# Patient Record
Sex: Female | Born: 1955 | Race: Black or African American | Hispanic: No | Marital: Married | Smoking: Former smoker
Health system: Southern US, Community
[De-identification: ages and names within clinical notes are randomized; demographics above are authoritative.]

## PROBLEM LIST (undated history)

## (undated) DIAGNOSIS — F32A Depression, unspecified: Secondary | ICD-10-CM

## (undated) DIAGNOSIS — R0602 Shortness of breath: Secondary | ICD-10-CM

## (undated) DIAGNOSIS — K635 Polyp of colon: Secondary | ICD-10-CM

## (undated) DIAGNOSIS — R1310 Dysphagia, unspecified: Secondary | ICD-10-CM

## (undated) DIAGNOSIS — E739 Lactose intolerance, unspecified: Secondary | ICD-10-CM

## (undated) DIAGNOSIS — K5900 Constipation, unspecified: Secondary | ICD-10-CM

## (undated) DIAGNOSIS — J45909 Unspecified asthma, uncomplicated: Secondary | ICD-10-CM

## (undated) DIAGNOSIS — K219 Gastro-esophageal reflux disease without esophagitis: Secondary | ICD-10-CM

## (undated) DIAGNOSIS — M2550 Pain in unspecified joint: Secondary | ICD-10-CM

## (undated) DIAGNOSIS — M7989 Other specified soft tissue disorders: Secondary | ICD-10-CM

## (undated) DIAGNOSIS — K589 Irritable bowel syndrome without diarrhea: Secondary | ICD-10-CM

## (undated) DIAGNOSIS — F329 Major depressive disorder, single episode, unspecified: Secondary | ICD-10-CM

## (undated) DIAGNOSIS — N3010 Interstitial cystitis (chronic) without hematuria: Secondary | ICD-10-CM

## (undated) DIAGNOSIS — T783XXA Angioneurotic edema, initial encounter: Secondary | ICD-10-CM

## (undated) DIAGNOSIS — K760 Fatty (change of) liver, not elsewhere classified: Secondary | ICD-10-CM

## (undated) DIAGNOSIS — K829 Disease of gallbladder, unspecified: Secondary | ICD-10-CM

## (undated) DIAGNOSIS — L509 Urticaria, unspecified: Secondary | ICD-10-CM

## (undated) DIAGNOSIS — L309 Dermatitis, unspecified: Secondary | ICD-10-CM

## (undated) DIAGNOSIS — J309 Allergic rhinitis, unspecified: Secondary | ICD-10-CM

## (undated) DIAGNOSIS — E785 Hyperlipidemia, unspecified: Secondary | ICD-10-CM

## (undated) DIAGNOSIS — E559 Vitamin D deficiency, unspecified: Secondary | ICD-10-CM

## (undated) DIAGNOSIS — G4733 Obstructive sleep apnea (adult) (pediatric): Secondary | ICD-10-CM

## (undated) DIAGNOSIS — M797 Fibromyalgia: Secondary | ICD-10-CM

## (undated) DIAGNOSIS — M549 Dorsalgia, unspecified: Secondary | ICD-10-CM

## (undated) DIAGNOSIS — F419 Anxiety disorder, unspecified: Secondary | ICD-10-CM

## (undated) HISTORY — PX: ADENOIDECTOMY: SUR15

## (undated) HISTORY — DX: Interstitial cystitis (chronic) without hematuria: N30.10

## (undated) HISTORY — DX: Disease of gallbladder, unspecified: K82.9

## (undated) HISTORY — DX: Obstructive sleep apnea (adult) (pediatric): G47.33

## (undated) HISTORY — PX: TUBAL LIGATION: SHX77

## (undated) HISTORY — DX: Shortness of breath: R06.02

## (undated) HISTORY — DX: Unspecified asthma, uncomplicated: J45.909

## (undated) HISTORY — DX: Irritable bowel syndrome, unspecified: K58.9

## (undated) HISTORY — DX: Pain in unspecified joint: M25.50

## (undated) HISTORY — DX: Allergic rhinitis, unspecified: J30.9

## (undated) HISTORY — PX: APPENDECTOMY: SHX54

## (undated) HISTORY — DX: Gastro-esophageal reflux disease without esophagitis: K21.9

## (undated) HISTORY — DX: Fatty (change of) liver, not elsewhere classified: K76.0

## (undated) HISTORY — DX: Urticaria, unspecified: L50.9

## (undated) HISTORY — PX: VESICOVAGINAL FISTULA CLOSURE W/ TAH: SUR271

## (undated) HISTORY — DX: Lactose intolerance, unspecified: E73.9

## (undated) HISTORY — DX: Polyp of colon: K63.5

## (undated) HISTORY — DX: Dermatitis, unspecified: L30.9

## (undated) HISTORY — DX: Dorsalgia, unspecified: M54.9

## (undated) HISTORY — DX: Major depressive disorder, single episode, unspecified: F32.9

## (undated) HISTORY — DX: Vitamin D deficiency, unspecified: E55.9

## (undated) HISTORY — DX: Dysphagia, unspecified: R13.10

## (undated) HISTORY — DX: Other specified soft tissue disorders: M79.89

## (undated) HISTORY — DX: Constipation, unspecified: K59.00

## (undated) HISTORY — DX: Angioneurotic edema, initial encounter: T78.3XXA

## (undated) HISTORY — DX: Anxiety disorder, unspecified: F41.9

## (undated) HISTORY — DX: Hyperlipidemia, unspecified: E78.5

## (undated) HISTORY — DX: Fibromyalgia: M79.7

## (undated) HISTORY — DX: Depression, unspecified: F32.A

## (undated) HISTORY — PX: ABDOMINAL HYSTERECTOMY: SHX81

---

## 1999-08-04 ENCOUNTER — Encounter: Admission: RE | Admit: 1999-08-04 | Discharge: 1999-08-04 | Payer: Self-pay | Admitting: Family Medicine

## 1999-08-04 ENCOUNTER — Encounter: Payer: Self-pay | Admitting: Family Medicine

## 1999-09-25 ENCOUNTER — Emergency Department (HOSPITAL_COMMUNITY): Admission: EM | Admit: 1999-09-25 | Discharge: 1999-09-25 | Payer: Self-pay | Admitting: *Deleted

## 2000-01-06 ENCOUNTER — Encounter: Payer: Self-pay | Admitting: Maternal and Fetal Medicine

## 2000-01-06 ENCOUNTER — Ambulatory Visit (HOSPITAL_COMMUNITY): Admission: RE | Admit: 2000-01-06 | Discharge: 2000-01-06 | Payer: Self-pay | Admitting: Maternal and Fetal Medicine

## 2001-01-09 ENCOUNTER — Encounter: Payer: Self-pay | Admitting: Maternal and Fetal Medicine

## 2001-01-09 ENCOUNTER — Ambulatory Visit (HOSPITAL_COMMUNITY): Admission: RE | Admit: 2001-01-09 | Discharge: 2001-01-09 | Payer: Self-pay | Admitting: Obstetrics

## 2001-04-18 ENCOUNTER — Ambulatory Visit (HOSPITAL_COMMUNITY): Admission: RE | Admit: 2001-04-18 | Discharge: 2001-04-18 | Payer: Self-pay | Admitting: Family Medicine

## 2002-02-19 ENCOUNTER — Ambulatory Visit (HOSPITAL_COMMUNITY): Admission: RE | Admit: 2002-02-19 | Discharge: 2002-02-19 | Payer: Self-pay | Admitting: Family Medicine

## 2002-02-19 ENCOUNTER — Encounter: Payer: Self-pay | Admitting: Family Medicine

## 2002-10-28 ENCOUNTER — Ambulatory Visit (HOSPITAL_COMMUNITY): Admission: RE | Admit: 2002-10-28 | Discharge: 2002-10-28 | Payer: Self-pay | Admitting: Gastroenterology

## 2003-04-09 ENCOUNTER — Ambulatory Visit (HOSPITAL_COMMUNITY): Admission: RE | Admit: 2003-04-09 | Discharge: 2003-04-09 | Payer: Self-pay | Admitting: Obstetrics & Gynecology

## 2004-04-13 ENCOUNTER — Ambulatory Visit (HOSPITAL_COMMUNITY): Admission: RE | Admit: 2004-04-13 | Discharge: 2004-04-13 | Payer: Self-pay | Admitting: Family Medicine

## 2004-09-04 ENCOUNTER — Emergency Department (HOSPITAL_COMMUNITY): Admission: EM | Admit: 2004-09-04 | Discharge: 2004-09-04 | Payer: Self-pay | Admitting: Emergency Medicine

## 2005-09-21 ENCOUNTER — Ambulatory Visit: Payer: Self-pay | Admitting: Emergency Medicine

## 2005-10-17 ENCOUNTER — Ambulatory Visit: Payer: Self-pay | Admitting: Emergency Medicine

## 2006-01-24 ENCOUNTER — Ambulatory Visit (HOSPITAL_COMMUNITY): Admission: RE | Admit: 2006-01-24 | Discharge: 2006-01-24 | Payer: Self-pay | Admitting: Gastroenterology

## 2006-07-21 ENCOUNTER — Ambulatory Visit: Payer: Self-pay | Admitting: Emergency Medicine

## 2006-08-18 ENCOUNTER — Ambulatory Visit: Payer: Self-pay | Admitting: Internal Medicine

## 2006-10-18 ENCOUNTER — Ambulatory Visit: Payer: Self-pay | Admitting: Internal Medicine

## 2006-11-14 ENCOUNTER — Ambulatory Visit (HOSPITAL_COMMUNITY): Admission: RE | Admit: 2006-11-14 | Discharge: 2006-11-14 | Payer: Self-pay | Admitting: Obstetrics & Gynecology

## 2007-02-15 ENCOUNTER — Ambulatory Visit: Payer: Self-pay | Admitting: Internal Medicine

## 2007-02-15 DIAGNOSIS — J309 Allergic rhinitis, unspecified: Secondary | ICD-10-CM | POA: Insufficient documentation

## 2007-02-15 DIAGNOSIS — J45909 Unspecified asthma, uncomplicated: Secondary | ICD-10-CM | POA: Insufficient documentation

## 2007-02-15 DIAGNOSIS — L509 Urticaria, unspecified: Secondary | ICD-10-CM | POA: Insufficient documentation

## 2007-02-15 DIAGNOSIS — K219 Gastro-esophageal reflux disease without esophagitis: Secondary | ICD-10-CM | POA: Insufficient documentation

## 2007-07-27 ENCOUNTER — Emergency Department (HOSPITAL_COMMUNITY): Admission: EM | Admit: 2007-07-27 | Discharge: 2007-07-27 | Payer: Self-pay | Admitting: Emergency Medicine

## 2007-08-28 ENCOUNTER — Ambulatory Visit: Payer: Self-pay | Admitting: Internal Medicine

## 2007-11-15 ENCOUNTER — Ambulatory Visit (HOSPITAL_COMMUNITY): Admission: RE | Admit: 2007-11-15 | Discharge: 2007-11-15 | Payer: Self-pay | Admitting: Obstetrics & Gynecology

## 2008-01-18 ENCOUNTER — Telehealth (INDEPENDENT_AMBULATORY_CARE_PROVIDER_SITE_OTHER): Payer: Self-pay | Admitting: *Deleted

## 2008-06-09 ENCOUNTER — Ambulatory Visit: Payer: Self-pay | Admitting: Internal Medicine

## 2008-12-18 ENCOUNTER — Ambulatory Visit (HOSPITAL_COMMUNITY): Admission: RE | Admit: 2008-12-18 | Discharge: 2008-12-18 | Payer: Self-pay | Admitting: Family Medicine

## 2009-05-25 ENCOUNTER — Emergency Department (HOSPITAL_COMMUNITY): Admission: EM | Admit: 2009-05-25 | Discharge: 2009-05-25 | Payer: Self-pay | Admitting: Emergency Medicine

## 2010-02-10 ENCOUNTER — Encounter (INDEPENDENT_AMBULATORY_CARE_PROVIDER_SITE_OTHER): Payer: Self-pay | Admitting: *Deleted

## 2010-02-10 ENCOUNTER — Emergency Department (HOSPITAL_COMMUNITY): Admission: EM | Admit: 2010-02-10 | Discharge: 2010-02-10 | Payer: Self-pay | Admitting: Emergency Medicine

## 2010-06-13 HISTORY — PX: TONSILLECTOMY: SUR1361

## 2010-06-22 ENCOUNTER — Telehealth (INDEPENDENT_AMBULATORY_CARE_PROVIDER_SITE_OTHER): Payer: Self-pay | Admitting: *Deleted

## 2010-07-03 ENCOUNTER — Encounter: Payer: Self-pay | Admitting: Family Medicine

## 2010-07-04 ENCOUNTER — Encounter: Payer: Self-pay | Admitting: Obstetrics & Gynecology

## 2010-07-12 ENCOUNTER — Ambulatory Visit: Admit: 2010-07-12 | Payer: Self-pay | Admitting: Internal Medicine

## 2010-07-13 NOTE — Letter (Signed)
Summary: New Patient letter  [facility]  [address] [place], [place] [address]   Phone: [phone]  Fax: [phone]       02/10/2010 MRN: [id number]  [facility] [address] [place], [place]  [address]  Dear [name],  Welcome to the Gastroenterology Division at [facility].    You are scheduled to see Dr.  Arlyce Dice on 03-25-10 at 10:00a.m. on the 3rd floor at [facility], [address].  We ask that you try to arrive at our office 15 minutes prior to your appointment time to allow for check-in.  We would like you to complete the enclosed self-administered evaluation form prior to your visit and bring it with you on the day of your appointment.  We will review it with you.  Also, please bring a complete list of all your medications or, if you prefer, bring the medication bottles and we will list them.  Please bring your insurance card so that we may make a copy of it.  If your insurance requires a referral to see a specialist, please bring your referral form from your primary care physician.  Co-payments are due at the time of your visit and may be paid by cash, check or credit card.     Your office visit will consist of a consult with your physician (includes a physical exam), any laboratory testing he/she may order, scheduling of any necessary diagnostic testing (e.g. x-ray, ultrasound, CT-scan), and scheduling of a procedure (e.g. Endoscopy, Colonoscopy) if required.  Please allow enough time on your schedule to allow for any/all of these possibilities.    If you cannot keep your appointment, please call [phone] to cancel or reschedule prior to your appointment date.  This allows [place] the opportunity to schedule an appointment for another patient in need of care.  If you do not cancel or reschedule by 5 p.m. the business day prior to your appointment date, you will be charged a $50.00 late cancellation/no-show fee.    Thank you for  choosing [facility] for your medical needs.  We appreciate the opportunity to care for you.  Please visit [place] at our website  to learn more about our practice.                     Sincerely,                                                             The Gastroenterology Division

## 2010-07-15 NOTE — Progress Notes (Signed)
Summary: appointment-LMTCB x 1-pt returned call  Phone Note Call from Patient   Caller: Patient Call For: DR YOUNG Summary of Call: Patient came by and needs another inhailer preventil. She is waiting in the lobby. Initial call taken by: Vedia Coffer,  June 22, 2010 3:38 PM  Follow-up for Phone Call        patient stated that the nurse told her that she needed an appointment before she could get another inhailer. Dr. Sinclair Ship first available isnt until 2/9 and patient needs to be sooner than that. She can be reached [phone].Vedia Coffer  June 22, 2010 3:46 PM  Additional Follow-up for Phone Call Additional follow up Details #1::        CDY- is this okay to give her a sample of rescue inhaler if she schedules appt with you.  Last seen in 2009.  Pls advise thanks Vernie Murders  June 22, 2010 3:49 PM   Per CDY-yes this is okay to give as long as she knows to make and KEEP appt with CDY.Reynaldo Minium CMA  June 22, 2010 4:55 PM     Additional Follow-up for Phone Call Additional follow up Details #2::    LMOMTCB Vernie Murders  June 22, 2010 5:09 PM pT CALLED AND MADE AN APPT W/ DR YOUNG FOR THIS FRI 06/25/10 AT 9:45. NO CALL BACK NEEDED. Tivis Ringer, CNA  June 23, 2010 10:44 AM

## 2010-08-23 ENCOUNTER — Other Ambulatory Visit (HOSPITAL_COMMUNITY): Payer: Self-pay | Admitting: Obstetrics & Gynecology

## 2010-08-23 DIAGNOSIS — Z1231 Encounter for screening mammogram for malignant neoplasm of breast: Secondary | ICD-10-CM

## 2010-08-26 LAB — CBC
Hemoglobin: 13.4 g/dL (ref 12.0–15.0)
MCH: 30.6 pg (ref 26.0–34.0)
MCHC: 33.4 g/dL (ref 30.0–36.0)
MCV: 91.7 fL (ref 78.0–100.0)
RBC: 4.37 MIL/uL (ref 3.87–5.11)
WBC: 13 10*3/uL — ABNORMAL HIGH (ref 4.0–10.5)

## 2010-08-26 LAB — COMPREHENSIVE METABOLIC PANEL
Calcium: 10 mg/dL (ref 8.4–10.5)
Creatinine, Ser: 0.97 mg/dL (ref 0.4–1.2)
GFR calc non Af Amer: 60 mL/min — ABNORMAL LOW (ref >60)
Glucose, Bld: 138 mg/dL — ABNORMAL HIGH (ref 70–99)
Sodium: 140 mEq/L (ref 135–145)
Total Bilirubin: 1.3 mg/dL — ABNORMAL HIGH (ref 0.3–1.2)
Total Protein: 7.2 g/dL (ref 6.0–8.3)

## 2010-08-26 LAB — DIFFERENTIAL
Eosinophils Relative: 0 % (ref 0–5)
Lymphocytes Relative: 10 % — ABNORMAL LOW (ref 12–46)
Lymphs Abs: 1.3 10*3/uL (ref 0.7–4.0)
Monocytes Relative: 2 % — ABNORMAL LOW (ref 3–12)

## 2010-08-26 LAB — URINALYSIS, ROUTINE W REFLEX MICROSCOPIC
Glucose, UA: NEGATIVE mg/dL
Hgb urine dipstick: NEGATIVE
Ketones, ur: NEGATIVE mg/dL
Protein, ur: NEGATIVE mg/dL
pH: 6 (ref 5.0–8.0)

## 2010-08-26 LAB — POCT CARDIAC MARKERS
CKMB, poc: <1 ng/mL — ABNORMAL LOW (ref 1.0–8.0)
Myoglobin, poc: 49.8 ng/mL (ref 12–200)
Troponin i, poc: <0.05 ng/mL (ref 0.00–0.09)

## 2010-09-14 ENCOUNTER — Ambulatory Visit (HOSPITAL_COMMUNITY)
Admission: RE | Admit: 2010-09-14 | Discharge: 2010-09-14 | Disposition: A | Discharge status: Home / Self Care | Payer: 59 | Source: Ambulatory Visit | Attending: Obstetrics & Gynecology | Admitting: Obstetrics & Gynecology

## 2010-09-14 DIAGNOSIS — Z1231 Encounter for screening mammogram for malignant neoplasm of breast: Secondary | ICD-10-CM | POA: Insufficient documentation

## 2010-10-26 NOTE — Assessment & Plan Note (Signed)
[facility]                             PULMONARY OFFICE NOTE   NAME:Sharon Barry, Sharon Barry                     MRN:          [id number]  DATE:02/15/2007                            DOB:          1956-05-06    PROBLEM LIST:  1. Allergic rhinitis.  2. Mild atopic asthma.  3. Probable esophageal reflux.   HISTORY:  Eyes itching, some generalized itching, but no specific rash.  Benadryl at night at least helps her sleep.  She does not know if it  does anything else.  She complains of mucous in her throat and complains  of halitosis with postnasal drip, but does not seem able to bring out  much.  Having some hot flashes.  Skin testing in May had been positive  primarily for grass and weed pollens, house dust, and dust mite.  This  was reviewed with her.  She does not think current therapy has been  sufficient.   MEDICATIONS:  1. Ambien 10 mg q.h.s.  2. Asmanex 220 mg one puff at q.h.s.  3. Nasacort AQ.  4. Singulair 10 mg.  5. Premarin 0.3 mg.  6. Claritin D p.r.n.  7. Proventil HFA.  8. Home albuterol nebulizer.  9. Occasional Benadryl.   ALLERGIES:  SULFA, ADVAIR.   OBJECTIVE:  VITAL SIGNS:  Weight 148 pounds, blood pressure 114/78,  pulse 73, room air saturation 100%.  HEENT:  There are inclusion cysts on both tonsils without erythema or  exudate.  There is bilateral nasal turbinate edema giving speech a nasal  stuffy quality.  I do not see postnasal drainage.  She is not hoarse.  I  do not find adenopathy.  CHEST:  Clear.  HEART:  Sounds normal.   IMPRESSION:  1. Rhinitis with exacerbation.  2. Pruritus, nonspecific.   PLAN:  1. Sample Patanase one spray each nostril daily p.r.n.  2. Saline nasal lavage.  3. Mucinex.  4. Schedule return in two months follow-up.  Consider allergy testing      if needed.  Dust and mold avoidance was emphasized.     Clinton D. Maple Hudson, MD, Tonny Bollman, FACP  Electronically Signed    CDY/MedQ  DD: 02/18/2007   DT: 02/18/2007  Job #: [id number]   cc:   Duwayne Heck L. Mahaffey, M.D.

## 2010-10-29 NOTE — Op Note (Signed)
NAME:  Sharon Barry, Sharon Barry                        ACCOUNT NO.:  [id number]   MEDICAL RECORD NO.:  [id number]                   PATIENT TYPE:  AMB   LOCATION:  ENDO                                 FACILITY:  [facility]   PHYSICIAN:  Anselmo Rod, M.D.               DATE OF BIRTH:  04/23/1956   DATE OF PROCEDURE:  10/28/2002  DATE OF DISCHARGE:                                 OPERATIVE REPORT   PROCEDURE PERFORMED:  Screening colonoscopy.   ENDOSCOPIST:  Anselmo Rod, M.D.   INSTRUMENT USED:  Olympus videocolonoscope.   INDICATION FOR THE PROCEDURE:  A 55 year old African-American female with a  history of abdominal pain and rectal bleeding, rule out colonic polyps,  masses, etc.   PROCEDURE PREPARATION:  Informed consent was procured from the patient.  The  patient had fasted for eight hours prior to the procedure and prepped with a  bottle of magnesium citrate and a gallon of GoLYTELY the night prior to the  procedure.   PREPROCEDURE PHYSICAL:  VITAL SIGNS:  Stable.  NECK:  Supple.  CHEST:  Clear to auscultation.  S1 and S2 regular.  ABDOMEN:  Soft with normal bowel sounds.   DESCRIPTION OF THE PROCEDURE:  The patient was placed in the left lateral  decubitus position and sedated with 50 mg of Demerol and 5 mg of Versed  intravenously.  Once the patient was adequately sedated, and maintained on  low-flow oxygen and continuous cardiac monitoring, the Olympus  videocolonoscope was advanced from the rectum to the cecum without  difficulty.  There was some residual stool in the colon.  Multiple washings  were done.  Small lesions could have been missed.  The appendiceal orifice  and the ileocecal valve were clearly visualized and photographed.  No  masses, polyps, erosions, ulcerations, or diverticulosis was seen.  Small  internal hemorrhoids were seen on retroflexion in the rectum.  No other  abnormalities were identified.  The patient tolerated the procedure well  without  complications.   IMPRESSION:  1. Normal colonoscopy up to the cecum except for small, nonbleeding internal     hemorrhoids.  2. Some residual stool in the colon.  Very small lesions could have been     missed.    RECOMMENDATIONS:  1. A high fiber diet with liberal fluid intake has been advised.  2. Outpatient followup in the next two weeks or earlier if need be.                                               Anselmo Rod, M.D.    JNM/MEDQ  D:  10/28/2002  T:  10/28/2002  Job:  [id number]   cc:   Duwayne Heck L. Mahaffey, M.D.  [address]  [place]  [place] [address]  Fax: [phone]

## 2010-10-29 NOTE — Assessment & Plan Note (Signed)
[facility]                             PULMONARY OFFICE NOTE   NAME:Barry, Sharon KEENER                     MRN:          [id number]  DATE:10/18/2006                            DOB:          28-Nov-1955    PROBLEMS:  1. Allergic rhinitis.  2. Mild atopic asthma.  3. Probable esophageal reflux.   HISTORY:  She got tight in the chest initially when she went down to the  humidity in [place], but gradually got used to that. No recent obvious  reflux events. She comes today for allergy testing.   MEDICATIONS:  1. Ambien 10 mg.  2. Asmanex 1 puff nightly.  3. Nasacort AQ.  4. Singulair 10 mg.  5. AcipHex 20 mg.  6. Effexor 75 mg.  7. Claritin D.  8. Proventil HFN rescue inhaler.  9. Nebulizer with albuterol daily p.r.n.  10.Benadryl occasional p.r.n.  Drug intolerant of SULFA and ADVAIR.   OBJECTIVE:  Weight 159 pounds, blood pressure 114/76, pulse regular 88,  room air saturation 98%. Skin reveals diffuse ichthyosis. Conjunctivae  are clear. Nose is wet, but not obstructed. Lungs are clear. Heart  sounds are regular without murmur.   SKIN TEST:  Positive histamine, negative diluent controls. Positive  reactions particularly for house dust and dust mite, but also for a  mixture of grass, weed, and tree pollens.   IMPRESSION:  Allergic rhinitis, allergic asthma, a component of  esophageal reflux.   PLAN:  We discussed treatment options. Currently, she feels present  medicines are satisfactory. I have reviewed the possible addition of  allergy vaccine and discussed the risk and benefits with particular  interest if we find that she requires daily medication or is difficult  to control over long blocks of time. We are scheduling return in 4  months. She is going to pay  particular attention to how she feels through this spring and summer  using her existing medications. We are scheduling return in 4 months,  earlier p.r.n.     Clinton D.  Maple Hudson, MD, Tonny Bollman, FACP  Electronically Signed    CDY/MedQ  DD: 10/18/2006  DT: 10/19/2006  Job #: [id number]   cc:   Duwayne Heck L. Mahaffey, M.D.

## 2010-10-29 NOTE — Assessment & Plan Note (Signed)
[facility]                             PULMONARY OFFICE NOTE   NAME:Langenberg, JOSIANE LABINE                     MRN:          [id number]  DATE:07/21/2006                            DOB:          [birth year]    SUBJECTIVE:  Ms. Gariepy is a pleasant, 55 year old woman with a  history of allergic rhinitis, severe postnasal drip and associated upper  airway irritation and vocal cord dysfunction.  She also probably has  mild asthma that has been trigger-related in the past.  In particular,  she is sensitive to perfumes, car exhaust and smoke.  Since our last  visit, her breathing has been quite good and she is using Asmanex q.h.s.  for maintenance therapy.  She does not have any pulmonary limitations at  this time.  She is not using any supplemental albuterol.  She does  complain of clear postnasal drip that is increased since our last visit,  and which appears to be resulting in worsening cough.  She has also  noticed that she is doing more throat-clearing than at our prior visit.  She had been on scheduled Singulair, Claritin and Nasacort, but she has  only been using these medications on as-needed basis for several months.  She also was previously using scheduled nasal saline washes at least  once a day.  She believes that, when she was doing the nasal saline  washes, her postnasal drip was better than it is now.  She denies any  headache, sinus pressure or colored nasal drainage or epistaxis.   MEDICATIONS:  1. Ambien 10 mg q.h.s.  2. Asmanex 220 micrograms one inhalation q.h.s.  3. Nasacort AQ one spray to each nostril q.a.m. p.r.n.  4. Singulair 10 mg q.h.s. p.r.n.  5. AcipHex 20 mg daily.  6. Effexor 75 mg daily.  7. Claritin 10 mg daily p.r.n.  8. Proventil two puffs q. 4 hours p.r.n.  9. Benadryl p.r.n.   EXAM:  IN GENERAL:  This is a pleasant, well-appearing African-American  woman, who is in no distress on room air.  Weight is 154 pounds,  temperature 98.3, blood pressure 124/88, heart rate 73, spO2 99% on room  air.  HEENT EXAM:  The oropharynx is clear.  NECK:  Supple without any lymphadenopathy or stridor.  LUNGS:  Clear to auscultation bilaterally in the normal respiratory  cycle and also on a forced expiration.  HEART:  Has a regular rate and rhythm without murmur.  ABDOMEN:  Benign.  EXTREMITIES:  Have no cyanosis, clubbing or edema.  NEUROLOGICALLY:  She has a nonfocal exam.   IMPRESSION:  1. Probable mild asthma with well-defined triggers.  This appears to      be under good control at this time.  2. Allergic rhinitis with continued postnasal drip.  3. Upper airway irritation and vocal cord dysfunction.  4. GERD that continues to be well-controlled.  Her Protonix has been      changed to AcipHex and she is tolerating this change well.   PLANS:  1. I have asked her to restart her previous regimen for her postnasal  drip, which includes scheduled nasal saline washes, daily Singulair      and Claritin, as well as daily Nasacort AQ.  2. I will refer Ms. Vanatta to see Dr. Jetty Duhamel in our office      for allergy evaluation to see if there is anything else beyond the      regimen above that we can do to assist her with her postnasal drip.  3. She will continue her Asmanex q.h.s. as currently ordered.  4. She will continue her AcipHex for her GERD, as currently ordered.  5. Ms. Koval will follow with me on an as-needed basis.     Leslye Peer, MD  Electronically Signed    RSB/MedQ  DD: 07/21/2006  DT: 07/21/2006  Job #: [id number]   cc:   Duwayne Heck L. Mahaffey, M.D.  Clinton D. Maple Hudson, MD, FCCP, FACP

## 2010-10-29 NOTE — Assessment & Plan Note (Signed)
[facility]                             PULMONARY OFFICE NOTE   NAME:Sharon Barry, Sharon Barry                     MRN:          [id number]  DATE:08/18/2006                            DOB:          December 06, 1955    PROBLEM:  Allergy consultation at the kind request of Dr. Delton Coombes for this  55 year old woman with concerns of allergic rhinitis, asthma, and vocal  cord dysfunction.   HISTORY:  She was seen by Dr. Delton Coombes in February with a history of  allergic rhinitis, postnasal drip, vocal cord dysfunction, mild asthma  with allergic and irritant triggers, and esophageal reflux.  He felt her  asthma was well controlled at that point and continued her on inhaled  steroid as well as reflux therapy.   MEDICATION:  1. Ambien 10 mg.  2. Asmanex one puff at h.s.  3. Nasacort AQ.  4. Singulair 10 mg.  5. AcipHex 20 mg.  6. Effexor 75 mg.  7. P.r.n. use of Claritin D or Benadryl.  8. Albuterol rescue inhaler.   Drug intolerant of SULFA and of ADVAIR.   She had taken Claritin the day prior to this evaluation.  She describes  most frequent treatments as irritant odors, smokes and dust, proximity  to dogs.  She is worst in spring and summer seasons.  Nasacort AQ caused  a sore throat.   REVIEW OF SYSTEMS:  Productive cough; chest pains; acid indigestion with  anorexia; some difficulty swallowing and sore throat; headaches; itching  of eyes, nose and throat; anxiety and depression.  Sputum is sometimes  yellow.  Eyes and scalp itch at night.  Bothersome perennial postnasal  drainage.   PAST HISTORY:  1. Asthma.  2. Elevated cholesterol.  3. Allergic rhinitis.  4. Urticarial reaction to an insect sting, undefined.  5. [place] food said to cause rash and is avoided.   No problems with latex or contrast dye.  Aspirin causes GI upset.   SURGICAL HISTORY:  Hysterectomy and tubal ligation.   SOCIAL HISTORY:  Quit smoking in 1996.  She is married with two  children, works as a Child psychotherapist.   ENVIRONMENTAL:  They live in a house that is 56 years old.  Basement is  somewhat musty and she gets headaches sometimes if she is down there.  Gas heat, no pets.  Son smokes outdoors.  They have wall-to-wall carpet.  No encasings or air cleaners.  No feathers.   FAMILY HISTORY:  Mother had what sounds like a seasonal pattern of  angioedema and her brother would get swelling in the face if he mowed  the lawn.  Others with rheumatism and cancer, asthma.   OBJECTIVE:  VITAL SIGNS:  Weight 152 pounds, BP 122/90, pulse regular at  73, room air saturation 99%.  GENERAL:  She seems comfortable today with no evident rash or  adenopathy.  HEENT:  Nasal turbinates are distinctly pale and boggy with little  mucus, no visible polyps.  Her pharynx is clear with no erythema or  evidence of obvious drainage.  Conjunctivae are clear.  She can breathe  comfortably through  her nose with her mouth closed.  Speech quality is  normal.  There is no stridor.  There is a small left post cervical lymph  node which she says enlarges at times.  LUNG FIELDS:  Clear with unlabored breathing.  HEART SOUNDS:  Regular without murmur or gallop.  ABDOMEN:  Without hepatosplenomegaly.  EXTREMITIES:  Without cyanosis, clubbing or edema.   IMPRESSION:  1. Allergic rhinitis with irritant and allergic triggers suggested by      history.  2. Question allergic component to mild intermittent asthma.  3. Probable esophageal reflux.   PLAN:  We gave environmental information of emphasizing avoidance of  dust and irritant triggers.  Look harder at the basement where there is  suggestion that there may be must that is causing increased congestion  and headache.  She may need to run a dehumidifier.  Schedule return off  of antihistamines at least 3 days for skin testing.   I appreciate the chance to see her.     Clinton D. Maple Hudson, MD, Tonny Bollman, FACP  Electronically Signed    CDY/MedQ   DD: 08/19/2006  DT: 08/19/2006  Job #: [id number]   cc:   Duwayne Heck L. Mahaffey, M.D.

## 2011-03-04 LAB — CBC
Hemoglobin: 13.7
MCHC: 34
RBC: 4.47
RDW: 13.1

## 2011-03-04 LAB — COMPREHENSIVE METABOLIC PANEL
ALT: 22
AST: 22
Alkaline Phosphatase: 50
CO2: 26
Chloride: 105
GFR calc Af Amer: >60
GFR calc non Af Amer: >60
Glucose, Bld: 95
Potassium: 3.6
Sodium: 139

## 2011-03-04 LAB — URINALYSIS, ROUTINE W REFLEX MICROSCOPIC
Bilirubin Urine: NEGATIVE
Glucose, UA: NEGATIVE
Ketones, ur: NEGATIVE
Nitrite: NEGATIVE
pH: 7

## 2011-03-04 LAB — DIFFERENTIAL
Basophils Relative: 0
Eosinophils Absolute: 0
Eosinophils Relative: 1
Monocytes Absolute: 0.4
Monocytes Relative: 6

## 2011-03-04 LAB — WET PREP, GENITAL

## 2011-03-04 LAB — GC/CHLAMYDIA PROBE AMP, GENITAL: GC Probe Amp, Genital: NEGATIVE

## 2011-05-20 ENCOUNTER — Telehealth: Payer: Self-pay | Admitting: Internal Medicine

## 2011-05-20 NOTE — Telephone Encounter (Signed)
Sure - fine to switch

## 2011-05-20 NOTE — Telephone Encounter (Signed)
Dr. Sherene Sires are you okay with the switch. Please advise thanks

## 2011-05-20 NOTE — Telephone Encounter (Signed)
Dr. Maple Hudson per office protocol please advise if this change is okay with you. Thanks.

## 2011-05-21 NOTE — Telephone Encounter (Signed)
Ok with me needs to bring every active med, neb solution, inhaler with her to [facility]

## 2011-05-23 NOTE — Telephone Encounter (Signed)
Pt already scheduled to see MW on 06-16-11- I just want to inform her needs to bring all meds in hand including OTC meds and inhalers/neb solutions to appt. ATC her and NA, mailbox full so no option to leave a msg.

## 2011-05-24 NOTE — Telephone Encounter (Signed)
ATC x 1. Mailbox is full. WCB later.

## 2011-05-25 NOTE — Telephone Encounter (Signed)
ATC NA and no option to leave a msg 

## 2011-05-26 NOTE — Telephone Encounter (Signed)
LMTCB x 1 at cell # for pt. 

## 2011-05-27 NOTE — Telephone Encounter (Signed)
Called spoke with patient on mobile number.  Informed her to bring all her medication bottles with her to ov: prescriptions, OTC, nebs and inhalers.  Pt verbalized her understanding.  Will sign off.

## 2011-06-15 ENCOUNTER — Encounter: Payer: Self-pay | Admitting: Pulmonary Disease

## 2011-06-16 ENCOUNTER — Encounter: Payer: Self-pay | Admitting: Internal Medicine

## 2011-06-16 ENCOUNTER — Ambulatory Visit (INDEPENDENT_AMBULATORY_CARE_PROVIDER_SITE_OTHER): Payer: 59 | Admitting: Internal Medicine

## 2011-06-16 ENCOUNTER — Ambulatory Visit (INDEPENDENT_AMBULATORY_CARE_PROVIDER_SITE_OTHER)
Admission: RE | Admit: 2011-06-16 | Discharge: 2011-06-16 | Disposition: A | Discharge status: Home / Self Care | Payer: 59 | Source: Ambulatory Visit | Attending: Internal Medicine | Admitting: Internal Medicine

## 2011-06-16 VITALS — BP 122/90 | HR 67 | Temp 98.0°F | Ht 65.0 in | Wt 184.6 lb

## 2011-06-16 DIAGNOSIS — J45909 Unspecified asthma, uncomplicated: Secondary | ICD-10-CM

## 2011-06-16 MED ORDER — MONTELUKAST SODIUM 10 MG PO TABS: 10.0000 mg | ORAL_TABLET | Freq: Every day | ORAL | Status: DC

## 2011-06-16 NOTE — Progress Notes (Signed)
  Subjective:    Patient ID: Sharon Barry, female    DOB: Nov 15, 1955, 56 y.o.   MRN: [id number]  HPI  29 yobf  Quit smoking 1995 no breathing problems then 2000 began trouble breathing intermittently worse in extreme hot or cold but present daily referred 06/16/2011 to pulmonary clinic by Dr Orson Aloe   06/16/2011 1st pulmonary eval cc constant urge to clear throat x 10 plus years worse with exposure to exhaust or perfume or cig smoking already found allergic to dust and mold tends to cough about a  tsp each am clear mucus,   does not disturb sleep,  much  better on inhalers previously (not sure which one helped but on multiple types per records review).   Worse than usual x one month assoc with mild sob mostly when coughing.  No overt sinus or hb complaints.  Sleeping ok without nocturnal  or early am exacerbation  of respiratory  c/o's or need for noct saba. Also denies any obvious fluctuation of symptoms with weather or environmental changes or other aggravating or alleviating factors except as outlined above       Review of Systems  Constitutional: Negative for fever, chills and unexpected weight change.  HENT: Positive for sneezing. Negative for ear pain, nosebleeds, congestion, sore throat, rhinorrhea, trouble swallowing, dental problem, voice change, postnasal drip and sinus pressure.   Eyes: Negative for visual disturbance.  Respiratory: Positive for shortness of breath. Negative for choking.   Cardiovascular: Positive for leg swelling. Negative for chest pain.  Gastrointestinal: Negative for vomiting, abdominal pain and diarrhea.  Genitourinary: Negative for difficulty urinating.  Musculoskeletal: Positive for arthralgias.  Skin: Negative for rash.  Neurological: Negative for tremors, syncope and headaches.  Hematological: Does not bruise/bleed easily.       Objective:   Physical Exam amb bf nad, unusual affect, freq throat clearing  06/16/2011  Wt 184  HEENT: nl  dentition, turbinates, and orophanx. Nl external ear canals without cough reflex   NECK :  without JVD/Nodes/TM/ nl carotid upstrokes bilaterally   LUNGS: no acc muscle use, clear to A and P bilaterally without cough on insp or exp maneuvers   CV:  RRR  no s3 or murmur or increase in P2, no edema   ABD:  soft and nontender with nl excursion in the supine position. No bruits or organomegaly, bowel sounds nl  MS:  warm without deformities, calf tenderness, cyanosis or clubbing  SKIN: warm and dry without lesions    NEURO:  alert, approp, no deficits   CXR  06/16/2011 :  No acute disease.         Assessment & Plan:

## 2011-06-16 NOTE — Assessment & Plan Note (Addendum)
Symptoms are markedly disproportionate to objective findings and not clear this is a lung problem but pt does appear to have difficult airway management issues. DDX of  difficult airways managment all start with A and  include Adherence, Ace Inhibitors, Acid Reflux, Active Sinus Disease, Alpha 1 Antitripsin deficiency, Anxiety masquerading as Airways dz,  ABPA,  allergy(esp in young), Aspiration (esp in elderly), Adverse effects of DPI,  Active smokers, plus two Bs  = Bronchiectasis and Beta blocker use..and one C= CHF   Adherence is always the initial "prime suspect" and is a multilayered concern that requires a "trust but verify" approach in every patient - starting with knowing how to use medications, especially inhalers, correctly, keeping up with refills and understanding the fundamental difference between maintenance and prns vs those medications only taken for a very short course and then stopped and not refilled.   For now makes the most sense to start one agent at a time and critically evaluated each one before adding another making sure in the end that " the punishment fits the crime" esp since so many of her symptoms seem so mild

## 2011-06-16 NOTE — Patient Instructions (Addendum)
GERD (REFLUX)  is an extremely common cause of respiratory symptoms, many times with no significant heartburn at all.    It can be treated with medication, but also with lifestyle changes including avoidance of late meals, excessive alcohol, smoking cessation, and avoid fatty foods, chocolate, peppermint, colas, red wine, and acidic juices such as orange juice.  NO MINT OR MENTHOL PRODUCTS SO NO COUGH DROPS  USE SUGARLESS CANDY INSTEAD (jolley ranchers or Stover's)  NO OIL BASED VITAMINS - use powdered substitutes. STOP FISH OIL  Singulair 10 mg every evening and if not better after a couple of weeks stop it and start Zegerd x2 at night until you return  Please remember to go to the  x-ray department downstairs for your tests - we will call you with the results when they are available.     Please schedule a follow up office visit in 4 weeks, sooner if needed with pft's

## 2011-06-29 NOTE — Progress Notes (Signed)
Quick Note:  lmtcb ______ 

## 2011-07-04 ENCOUNTER — Encounter: Payer: Self-pay | Admitting: *Deleted

## 2011-07-04 NOTE — Progress Notes (Signed)
Quick Note:  ATC and NA and no option to leave msg, Will send letter ______

## 2011-07-06 ENCOUNTER — Telehealth: Payer: Self-pay | Admitting: Internal Medicine

## 2011-07-06 NOTE — Telephone Encounter (Signed)
Notes Recorded by Sandrea Hughs, MD on 06/16/2011 at 9:18 PM Call pt: Reviewed cxr and no acute change so no change in recommendations made at ov  I spoke with patient about results and she verbalized understanding and had no questions

## 2011-07-20 ENCOUNTER — Ambulatory Visit (INDEPENDENT_AMBULATORY_CARE_PROVIDER_SITE_OTHER): Payer: 59 | Admitting: Internal Medicine

## 2011-07-20 ENCOUNTER — Encounter: Payer: Self-pay | Admitting: Internal Medicine

## 2011-07-20 ENCOUNTER — Other Ambulatory Visit (INDEPENDENT_AMBULATORY_CARE_PROVIDER_SITE_OTHER): Payer: 59

## 2011-07-20 VITALS — BP 116/74 | HR 78 | Temp 98.3°F | Ht 64.0 in | Wt 183.0 lb

## 2011-07-20 DIAGNOSIS — R059 Cough, unspecified: Secondary | ICD-10-CM

## 2011-07-20 DIAGNOSIS — J45909 Unspecified asthma, uncomplicated: Secondary | ICD-10-CM

## 2011-07-20 DIAGNOSIS — R05 Cough: Secondary | ICD-10-CM | POA: Insufficient documentation

## 2011-07-20 LAB — CBC WITH DIFFERENTIAL/PLATELET
Basophils Relative: 0.3 % (ref 0.0–3.0)
Eosinophils Relative: 0.8 % (ref 0.0–5.0)
HCT: 40.9 % (ref 36.0–46.0)
Hemoglobin: 13.6 g/dL (ref 12.0–15.0)
Lymphs Abs: 2.1 10*3/uL (ref 0.7–4.0)
MCV: 92.3 fl (ref 78.0–100.0)
Monocytes Absolute: 0.5 10*3/uL (ref 0.1–1.0)
Monocytes Relative: 5.9 % (ref 3.0–12.0)
Neutro Abs: 5.7 10*3/uL (ref 1.4–7.7)
RBC: 4.44 Mil/uL (ref 3.87–5.11)
WBC: 8.4 10*3/uL (ref 4.5–10.5)

## 2011-07-20 LAB — PULMONARY FUNCTION TEST

## 2011-07-20 NOTE — Patient Instructions (Addendum)
Zegrid take 2 at bedtime and stop the singulair  GERD (REFLUX)  is an extremely common cause of respiratory symptoms, many times with no significant heartburn at all.    It can be treated with medication, but also with lifestyle changes including avoidance of late meals, excessive alcohol, smoking cessation, and avoid fatty foods, chocolate, peppermint, colas, red wine, and acidic juices such as orange juice.  NO MINT OR MENTHOL PRODUCTS SO NO COUGH DROPS  USE SUGARLESS CANDY INSTEAD (jolley ranchers or Stover's)  NO OIL BASED VITAMINS - use powdered substitutes. (no fish oil)    Please see patient coordinator before you leave today  to schedule sinus CT  Please remember to go to the lab department downstairs for your tests - we will call you with the results when they are available.  Please schedule a follow up office visit in 4 weeks, sooner if needed

## 2011-07-20 NOTE — Progress Notes (Signed)
PFT done today. 

## 2011-07-20 NOTE — Assessment & Plan Note (Signed)
No evidence of asthma, try off singulair

## 2011-07-20 NOTE — Assessment & Plan Note (Signed)
The most common causes of chronic cough in immunocompetent adults include the following: upper airway cough syndrome (UACS), previously referred to as postnasal drip syndrome (PNDS), which is caused by variety of rhinosinus conditions; (2) asthma; (3) GERD; (4) chronic bronchitis from cigarette smoking or other inhaled environmental irritants; (5) nonasthmatic eosinophilic bronchitis; and (6) bronchiectasis.   These conditions, singly or in combination, have accounted for up to 94% of the causes of chronic cough in prospective studies.   Other conditions have constituted no >6% of the causes in prospective studies These have included bronchogenic carcinoma, chronic interstitial pneumonia, sarcoidosis, left ventricular failure, ACEI-induced cough, and aspiration from a condition associated with pharyngeal dysfunction.   This is most c/w  Classic Upper airway cough syndrome, so named because it's frequently impossible to sort out how much is  CR/sinusitis with freq throat clearing (which can be related to primary GERD)   vs  causing  secondary (" extra esophageal")  GERD from wide swings in gastric pressure that occur with throat clearing, often  promoting self use of mint and menthol lozenges that reduce the lower esophageal sphincter tone and exacerbate the problem further in a cyclical fashion.   These are the same pts who not infrequently have failed to tolerate ace inhibitors,  dry powder inhalers or biphosphonates or report having reflux symptoms that don't respond to standard doses of PPI , and are easily confused as having aecopd or asthma flares, which she does not appear to have  Therefor proceed with w/u including sinus ct, allergy eval and off singulair

## 2011-07-20 NOTE — Progress Notes (Signed)
  Subjective:    Patient ID: Sharon Barry, female    DOB: 1956-04-10     MRN: [id number]  HPI  26 yobf  Quit smoking 1995 no breathing problems then 2000 began trouble breathing intermittently worse in extreme hot or cold but present daily referred 06/16/2011 to pulmonary clinic by Dr Orson Aloe   06/16/2011 1st pulmonary eval cc constant urge to clear throat x 10 plus years worse with exposure to exhaust or perfume or cig smoking already found allergic to dust and mold tends to cough about a  tsp each am clear mucus,   does not disturb sleep,  much  better on inhalers previously (not sure which one helped but on multiple types per records review).   Worse than usual x one month assoc with mild sob mostly when coughing.  No overt sinus or hb complaints. rec Singulair 10 mg every evening and if not better after a couple of weeks stop it and start Zegerd x2 at night until you return      07/20/2011 f/u ov/Sharon Barry PFT done today.Marland KitchenMarland KitchenStill has prod cough with yellow mucus...worse in the mornings. Reports two siblings similar cough.  No overt hb symptoms, no better on singular     Sleeping ok without nocturnal  or early am exacerbation  of respiratory  c/o's or need for noct saba. Also denies any obvious fluctuation of symptoms with weather or environmental changes or other aggravating or alleviating factors except as outlined above.  ROS  At present neg for  any significant sore throat, dysphagia, itching, sneezing,  nasal congestion or excess/ purulent secretions,  fever, chills, sweats, unintended wt loss, pleuritic or exertional cp, hempoptysis, orthopnea pnd or leg swelling.  Also denies presyncope, palpitations, heartburn, abdominal pain, nausea, vomiting, diarrhea  or change in bowel or urinary habits, dysuria,hematuria,  rash, arthralgias, visual complaints, headache, numbness weakness or ataxia.               Objective:   Physical Exam amb bf nad, unusual affect, freq throat clearing  has resolved  06/16/2011  Wt 184 > 07/20/2011  183  HEENT: nl dentition, turbinates, and orophanx. Nl external ear canals without cough reflex   NECK :  without JVD/Nodes/TM/ nl carotid upstrokes bilaterally   LUNGS: no acc muscle use, clear to A and P bilaterally without cough on insp or exp maneuvers   CV:  RRR  no s3 or murmur or increase in P2, no edema   ABD:  soft and nontender with nl excursion in the supine position. No bruits or organomegaly, bowel sounds nl  MS:  warm without deformities, calf tenderness, cyanosis or clubbing     CXR  06/16/2011 :  No acute disease.         Assessment & Plan:

## 2011-07-21 ENCOUNTER — Encounter: Payer: Self-pay | Admitting: Internal Medicine

## 2011-07-22 ENCOUNTER — Encounter: Payer: Self-pay | Admitting: Internal Medicine

## 2011-07-22 LAB — ALLERGY PROFILE REGION II-DC, DE, MD, ~~LOC~~, VA
Allergen, D pternoyssinus,d7: <0.1 kU/L (ref <0.35)
Alternaria Alternata: <0.1 kU/L (ref <0.35)
Aspergillus fumigatus, IgG: <0.1 kU/L (ref <0.35)
Box Elder IgE: <0.1 kU/L (ref <0.35)
Cladosporium Herbarum: <0.1 kU/L (ref <0.35)
Cockroach: <0.1 kU/L (ref <0.35)
Dog Dander: <0.1 kU/L (ref <0.35)
Pecan/Hickory Tree IgE: <0.1 kU/L (ref <0.35)

## 2011-07-25 ENCOUNTER — Ambulatory Visit (INDEPENDENT_AMBULATORY_CARE_PROVIDER_SITE_OTHER)
Admission: RE | Admit: 2011-07-25 | Discharge: 2011-07-25 | Disposition: A | Discharge status: Home / Self Care | Payer: 59 | Source: Ambulatory Visit | Attending: Internal Medicine | Admitting: Internal Medicine

## 2011-07-25 DIAGNOSIS — J45909 Unspecified asthma, uncomplicated: Secondary | ICD-10-CM

## 2011-07-25 NOTE — Progress Notes (Signed)
Quick Note:  Spoke with pt and notified of results per Dr. Wert. Pt verbalized understanding and denied any questions.  ______ 

## 2011-07-26 ENCOUNTER — Encounter: Payer: Self-pay | Admitting: Internal Medicine

## 2012-03-16 ENCOUNTER — Other Ambulatory Visit (HOSPITAL_COMMUNITY): Payer: Self-pay | Admitting: Obstetrics & Gynecology

## 2012-03-16 DIAGNOSIS — Z1231 Encounter for screening mammogram for malignant neoplasm of breast: Secondary | ICD-10-CM

## 2012-04-23 ENCOUNTER — Ambulatory Visit (HOSPITAL_COMMUNITY)
Admission: RE | Admit: 2012-04-23 | Discharge: 2012-04-23 | Disposition: A | Discharge status: Home / Self Care | Payer: 59 | Source: Ambulatory Visit | Attending: Obstetrics & Gynecology | Admitting: Obstetrics & Gynecology

## 2012-04-23 DIAGNOSIS — Z1231 Encounter for screening mammogram for malignant neoplasm of breast: Secondary | ICD-10-CM | POA: Insufficient documentation

## 2012-05-06 IMAGING — CT CT PARANASAL SINUSES LIMITED
1 of 2 series · 16 of 19 positions shown, 20 images · non-contrast
Comparison: None.

CLINICAL DATA: 55-year-old female with sinus congestion, drainage,
cough.

CT LIMITED SINUSES WITHOUT CONTRAST
TECHNIQUE: Multidetector CT images of the paranasal sinuses were
obtained in a single plane without contrast.

[Series 4: ltd sinus 3.0 h30s · axial · 0.34mm/px · z∈[-114,-16]mm · 16 of 18 slices shown, 20 images]
[im 2/18  brain]
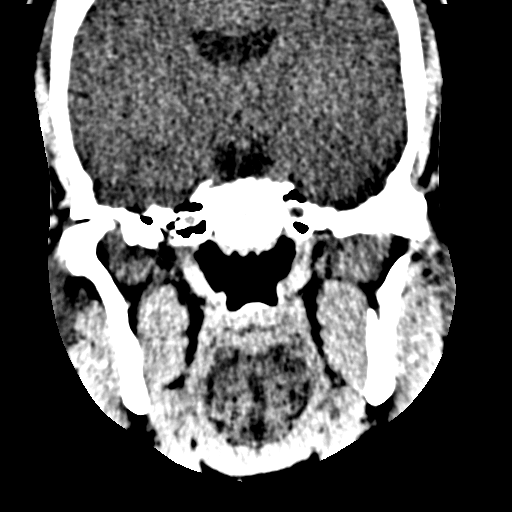
[im 2/18  bone]
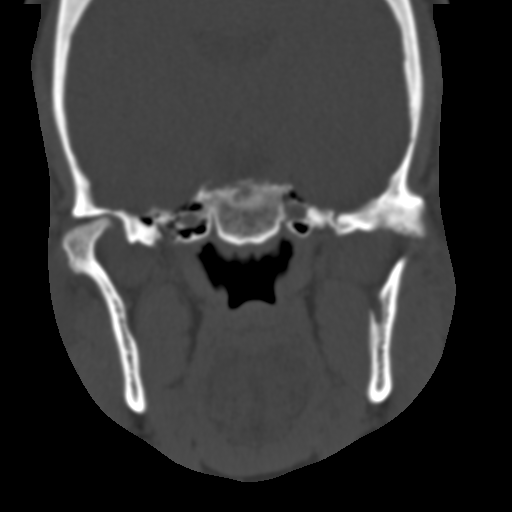
[im 3/18  bone]
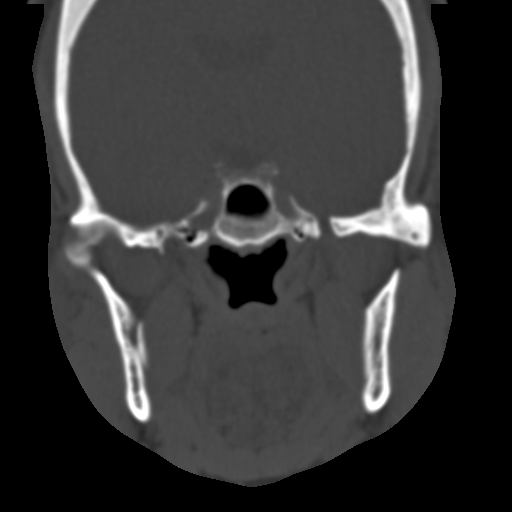
[im 4/18  bone]
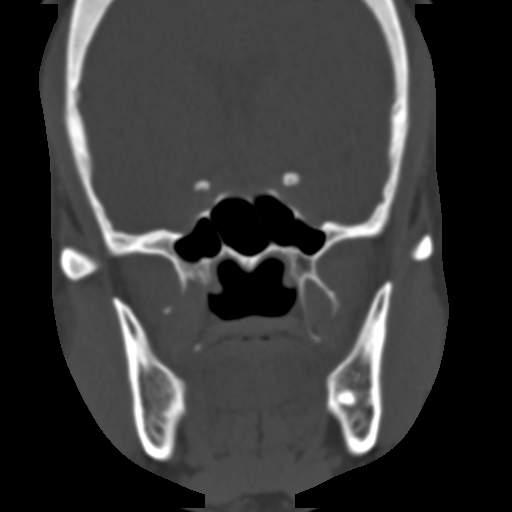
[im 5/18  bone]
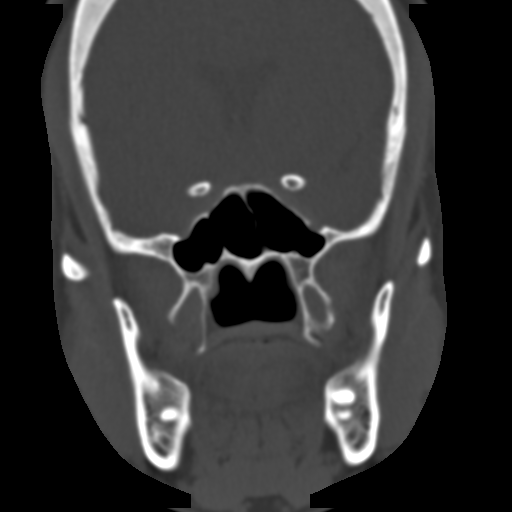
[im 6/18  brain]
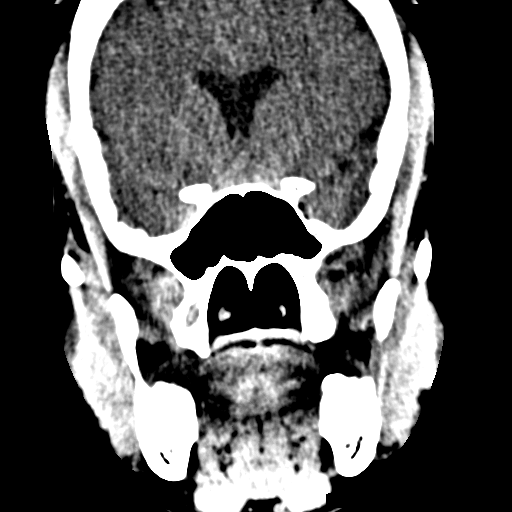
[im 6/18  bone]
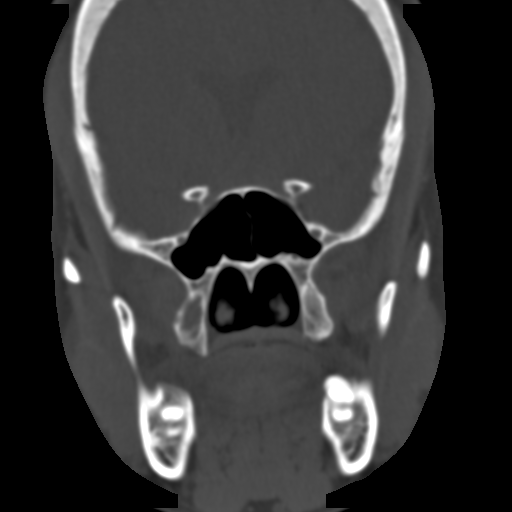
[im 7/18  bone]
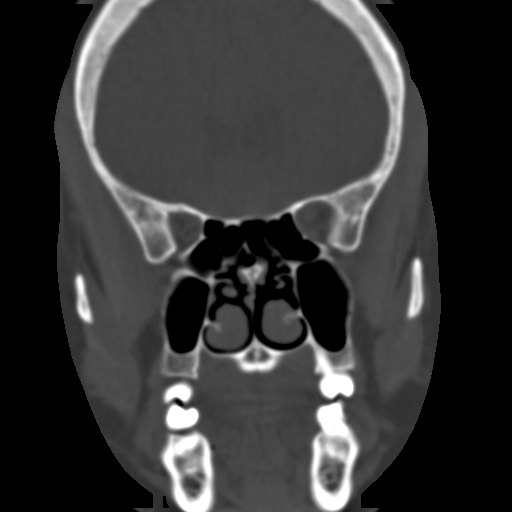
[im 8/18  bone]
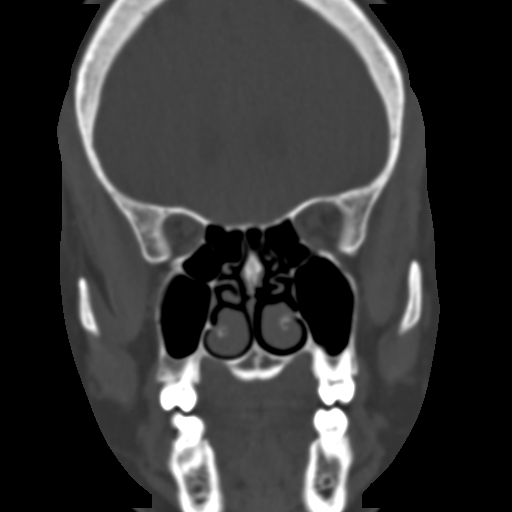
[im 9/18  bone]
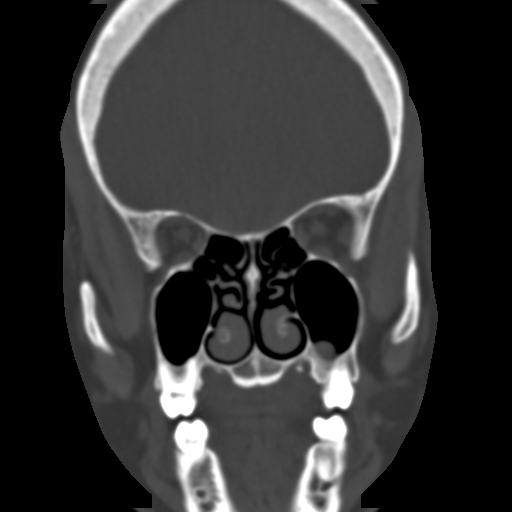
[im 10/18  brain]
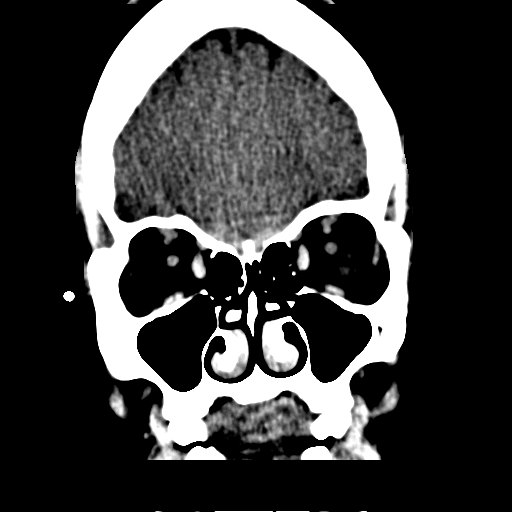
[im 10/18  bone]
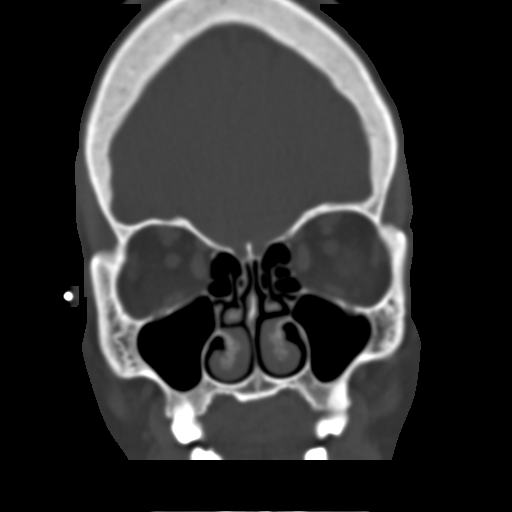
[im 11/18  bone]
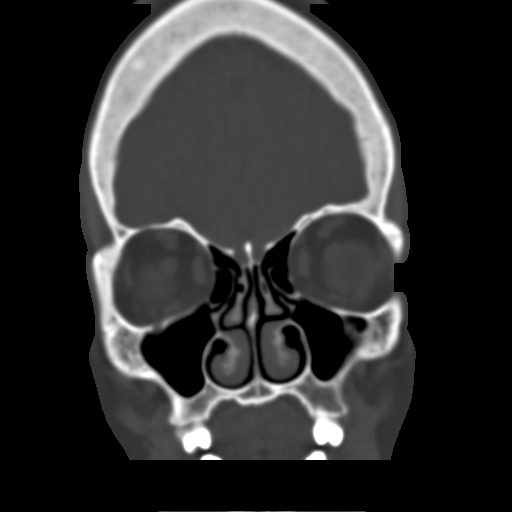
[im 12/18  bone]
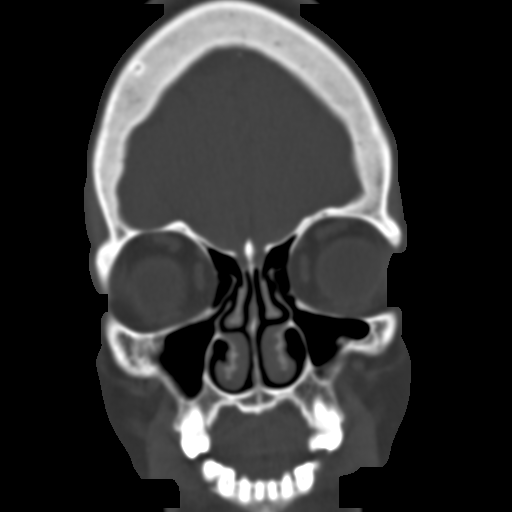
[im 13/18  bone]
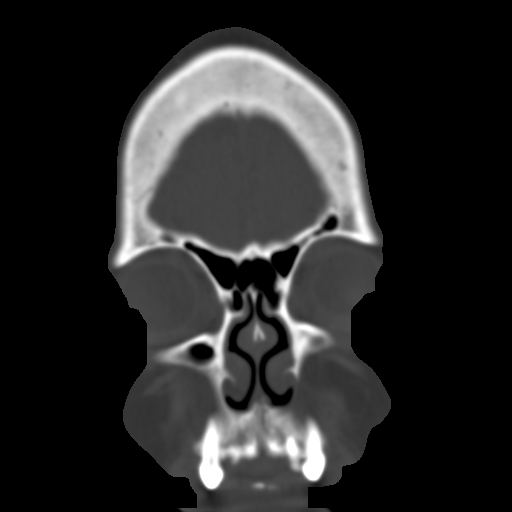
[im 14/18  brain]
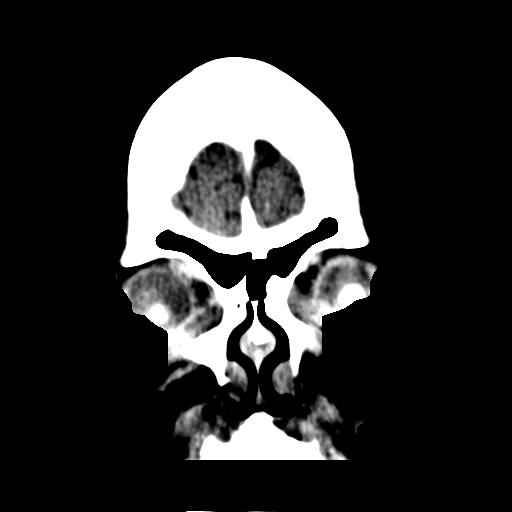
[im 14/18  bone]
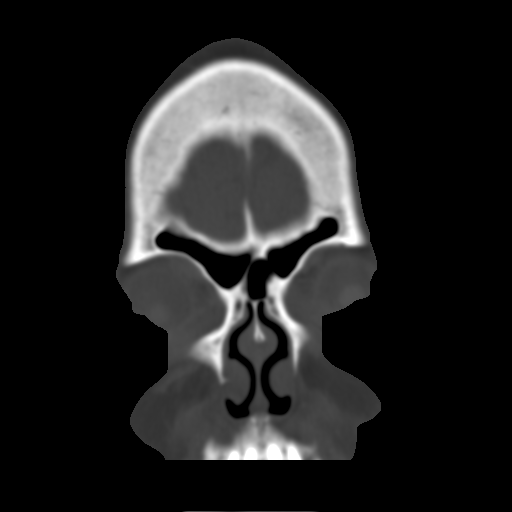
[im 15/18  bone]
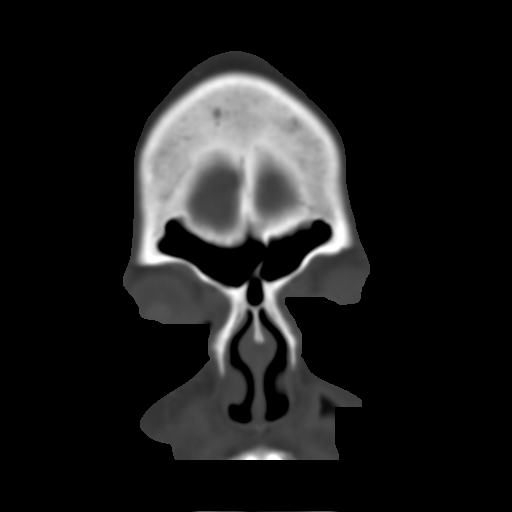
[im 16/18  bone]
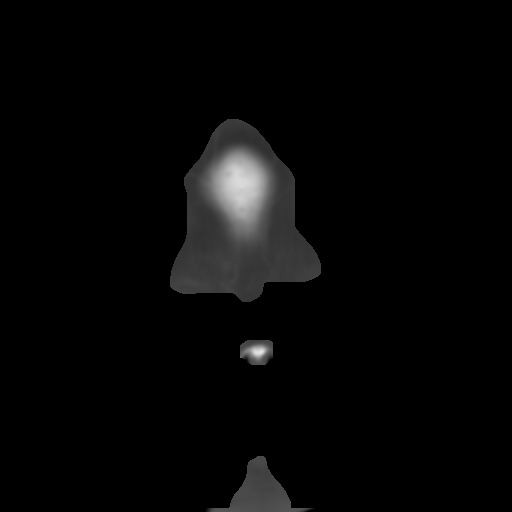
[im 17/18  bone]
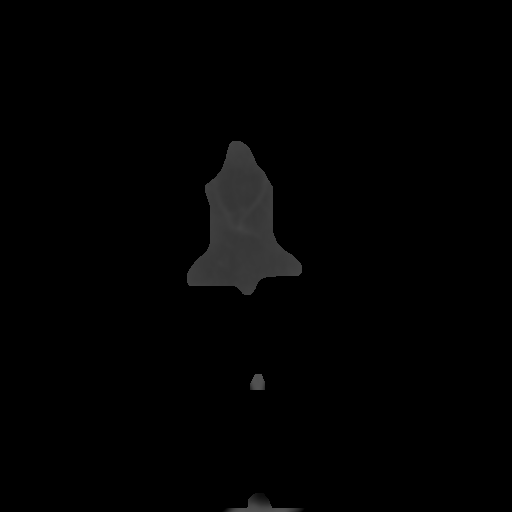

[16 of 19 positions shown; findings below may reference images not displayed]

FINDINGS: Negative visualized noncontrast brain parenchyma; the
septum pellucidum is felt to be diminutive but not absent.
Visualized orbit soft tissues are within normal limits.

Negative visualized deep soft tissue spaces of the face.

Visualized aerated petrous apex is clear.

Sphenoid sinuses are clear.
Ethmoid air cells are clear.
Frontal sinuses are clear.
Right maxillary sinuses clear.
Minimal polypoid mucosal thickening in the inferior left maxillary
sinus most resembles a small retention cyst.  Both OMCs are patent.

Nasal mucosal thickening suspected. No acute osseous abnormality
identified.
IMPRESSION: Negative paranasal sinuses.

## 2013-06-11 ENCOUNTER — Other Ambulatory Visit (HOSPITAL_COMMUNITY): Payer: Self-pay | Admitting: Family Medicine

## 2013-06-11 DIAGNOSIS — Z1231 Encounter for screening mammogram for malignant neoplasm of breast: Secondary | ICD-10-CM

## 2013-06-25 ENCOUNTER — Ambulatory Visit (HOSPITAL_COMMUNITY)
Admission: RE | Admit: 2013-06-25 | Discharge: 2013-06-25 | Disposition: A | Discharge status: Home / Self Care | Payer: 59 | Source: Ambulatory Visit | Attending: Family Medicine | Admitting: Family Medicine

## 2013-06-25 DIAGNOSIS — Z1231 Encounter for screening mammogram for malignant neoplasm of breast: Secondary | ICD-10-CM

## 2013-07-02 ENCOUNTER — Encounter (HOSPITAL_BASED_OUTPATIENT_CLINIC_OR_DEPARTMENT_OTHER): Payer: Self-pay | Admitting: Emergency Medicine

## 2013-07-02 ENCOUNTER — Emergency Department (HOSPITAL_BASED_OUTPATIENT_CLINIC_OR_DEPARTMENT_OTHER)
Admission: EM | Admit: 2013-07-02 | Discharge: 2013-07-03 | Disposition: A | Discharge status: Home / Self Care | Payer: 59 | Attending: Emergency Medicine | Admitting: Emergency Medicine

## 2013-07-02 DIAGNOSIS — Z862 Personal history of diseases of the blood and blood-forming organs and certain disorders involving the immune mechanism: Secondary | ICD-10-CM | POA: Insufficient documentation

## 2013-07-02 DIAGNOSIS — Z8639 Personal history of other endocrine, nutritional and metabolic disease: Secondary | ICD-10-CM | POA: Insufficient documentation

## 2013-07-02 DIAGNOSIS — J45909 Unspecified asthma, uncomplicated: Secondary | ICD-10-CM | POA: Insufficient documentation

## 2013-07-02 DIAGNOSIS — F3289 Other specified depressive episodes: Secondary | ICD-10-CM | POA: Insufficient documentation

## 2013-07-02 DIAGNOSIS — F329 Major depressive disorder, single episode, unspecified: Secondary | ICD-10-CM | POA: Insufficient documentation

## 2013-07-02 DIAGNOSIS — Z79899 Other long term (current) drug therapy: Secondary | ICD-10-CM | POA: Insufficient documentation

## 2013-07-02 DIAGNOSIS — R0789 Other chest pain: Secondary | ICD-10-CM | POA: Insufficient documentation

## 2013-07-02 DIAGNOSIS — Z8669 Personal history of other diseases of the nervous system and sense organs: Secondary | ICD-10-CM | POA: Insufficient documentation

## 2013-07-02 DIAGNOSIS — R079 Chest pain, unspecified: Secondary | ICD-10-CM

## 2013-07-02 DIAGNOSIS — Z8719 Personal history of other diseases of the digestive system: Secondary | ICD-10-CM | POA: Insufficient documentation

## 2013-07-02 DIAGNOSIS — Z872 Personal history of diseases of the skin and subcutaneous tissue: Secondary | ICD-10-CM | POA: Insufficient documentation

## 2013-07-02 DIAGNOSIS — Z87891 Personal history of nicotine dependence: Secondary | ICD-10-CM | POA: Insufficient documentation

## 2013-07-02 NOTE — ED Provider Notes (Signed)
CSN: [id number]     Arrival date & time 07/02/13  2339 History  This chart was scribed for Derwood Kaplan, MD by Dorothey Baseman, ED Scribe. This patient was seen in room MH06/MH06 and the patient's care was started at 12:07 AM.    Chief Complaint  Patient presents with  . Abdominal Pain   The history is provided by the patient. No language interpreter was used.   HPI Comments: Sharon Barry is a 58 y.o. female with a history of esophageal reflex and asthma who presents to the Emergency Department complaining of an intermittent, mild-moderate pain, described as a "tickling," just below the left breast onset about 9 hours ago. She denies any pain at this time. She states that she will have several episodes of the pain per hour, each episode lasting approximately one minute. Patient denies any pain radiation. She denies any exacerbating or alleviating factors. She states that she has experienced similar symptoms intermittently over the past several years, which she has seen a pulmonologist for, but has not received a diagnosis. Patient reports using her albuterol inhaler at home with mild, temporary relief. She denies abdominal pain, dysuria, or wheezes. Patient denies history of cardiac problems and states that she has not had a stress test before. Patient does not drink or smoke.   Past Medical History  Diagnosis Date  . Urticaria, unspecified     insects, chinese food  . Esophageal reflux   . Unspecified asthma(493.90)     mild reversable obst small airways  . Allergic rhinitis, cause unspecified     skin test pos 10/18/06  . Depression   . Hyperlipidemia   . OSA (obstructive sleep apnea)    Past Surgical History  Procedure Laterality Date  . Vesicovaginal fistula closure w/ tah    . Tubal ligation    . Tonsillectomy  2012   Family History  Problem Relation Age of Onset  . Lung cancer Father     smoked  . Stomach cancer Mother     cause of death  . Lung cancer Brother     smoked   . Lung cancer Maternal Grandfather     smoked  . Bone cancer Maternal Grandmother   . Allergies Mother    History  Substance Use Topics  . Smoking status: Former Smoker -- 0.50 packs/day for 20 years    Types: Cigarettes    Quit date: 06/13/1993  . Smokeless tobacco: Never Used  . Alcohol Use: No   OB History   Grav Para Term Preterm Abortions TAB SAB Ect Mult Living                 Review of Systems  Respiratory: Negative for wheezing.   Cardiovascular: Positive for chest pain.  Gastrointestinal: Negative for abdominal pain.  Genitourinary: Negative for dysuria.    Allergies  Fluticasone-salmeterol and Sulfonamide derivatives  Home Medications   Current Outpatient Rx  Name  Route  Sig  Dispense  Refill  . Ascorbic Acid (VITAMIN C) 1000 MG tablet   Oral   Take 1,000 mg by mouth daily.           . Cholecalciferol (VITAMIN D3) 1000 UNITS CAPS   Oral   Take 1 capsule by mouth daily.           . hydrOXYzine (ATARAX/VISTARIL) 25 MG tablet      1 at bedtime         . Multiple Vitamin (MULTIVITAMIN) capsule   Oral  Take 1 capsule by mouth daily.           . Red Yeast Rice 600 MG CAPS   Oral   Take 1 capsule by mouth daily.           . traZODone (DESYREL) 100 MG tablet      1/2 to 1 tablet at bedtime          Triage Vitals: BP 153/91  Pulse 65  Temp(Src) 98.3 F (36.8 C) (Oral)  Resp 16  Ht 5\' 5"  (1.651 m)  Wt 170 lb (77.111 kg)  BMI 28.29 kg/m2  SpO2 100%  Physical Exam  Nursing note and vitals reviewed. Constitutional: She is oriented to person, place, and time. She appears well-developed and well-nourished. No distress.  HENT:  Head: Normocephalic and atraumatic.  Eyes: Conjunctivae are normal.  Neck: Normal range of motion. Neck supple.  Cardiovascular: Normal rate, regular rhythm and normal heart sounds.   Pulmonary/Chest: Effort normal and breath sounds normal. No respiratory distress. She has no wheezes. She exhibits no  tenderness.  Left breast there is no nodules or pustules appreciated at the inferior aspect. Superiorly there is no evidence of nodules or pustules either. Pain is not reproducible with palpation.   Abdominal: She exhibits no distension.  Musculoskeletal: Normal range of motion.  Neurological: She is alert and oriented to person, place, and time.  Skin: Skin is warm and dry.  Psychiatric: She has a normal mood and affect. Her behavior is normal.    ED Course  Procedures (including critical care time)  DIAGNOSTIC STUDIES: Oxygen Saturation is 100% on room air, normal by my interpretation.    COORDINATION OF CARE: 12:13 AM- Will order blood labs. Discussed treatment plan with patient at bedside and patient verbalized agreement.     Labs Review Labs Reviewed - No data to display Imaging Review No results found.  EKG Interpretation   None       MDM  No diagnosis found. I personally performed the services described in this documentation, which was scribed in my presence. The recorded information has been reviewed and is accurate.   Date: 07/03/2013  Rate: 68  Rhythm: normal sinus rhythm  QRS Axis: normal  Intervals: normal  ST/T Wave abnormalities: normal  Conduction Disutrbances: none  Narrative Interpretation: unremarkable  Pt with HEART score of 2 comes in with atypical, intermittent left chest pain - behind her breast.  History  Slightly suspicious 0   ECG  Normal 0   Age  59 - 65 years 1   Risk Factors 1 or 2 risk factors 1    Troponin  ? normal limit 0   Pain is intermittent, no provoking factor, not exertional, or pleuritic, and not reproducible. Pt pain free during my eval. Tropsx 2 , ekg, cardiac monitoring normal. PCP f/u requested.         Derwood KaplanAnkit Lauraann Missey, MD 07/03/13 [phone]

## 2013-07-02 NOTE — ED Notes (Signed)
C/o pain in upper abd, under left breast since 3pm today. Pt was able to continue working. Pt states she tried to use her inhaler without relief.

## 2013-07-03 ENCOUNTER — Emergency Department (HOSPITAL_BASED_OUTPATIENT_CLINIC_OR_DEPARTMENT_OTHER): Payer: 59

## 2013-07-03 LAB — CBC WITH DIFFERENTIAL/PLATELET
BASOS PCT: 0 % (ref 0–1)
Basophils Absolute: 0 10*3/uL (ref 0.0–0.1)
Eosinophils Absolute: 0.1 10*3/uL (ref 0.0–0.7)
Eosinophils Relative: 1 % (ref 0–5)
HCT: 42 % (ref 36.0–46.0)
HEMOGLOBIN: 13.5 g/dL (ref 12.0–15.0)
LYMPHS ABS: 2.2 10*3/uL (ref 0.7–4.0)
LYMPHS PCT: 30 % (ref 12–46)
MCH: 29.7 pg (ref 26.0–34.0)
MCHC: 32.1 g/dL (ref 30.0–36.0)
MCV: 92.3 fL (ref 78.0–100.0)
MONOS PCT: 7 % (ref 3–12)
Monocytes Absolute: 0.5 10*3/uL (ref 0.1–1.0)
NEUTROS ABS: 4.5 10*3/uL (ref 1.7–7.7)
NEUTROS PCT: 62 % (ref 43–77)
Platelets: 304 10*3/uL (ref 150–400)
RBC: 4.55 MIL/uL (ref 3.87–5.11)
RDW: 12.7 % (ref 11.5–15.5)
WBC: 7.4 10*3/uL (ref 4.0–10.5)

## 2013-07-03 LAB — URINALYSIS, ROUTINE W REFLEX MICROSCOPIC
Bilirubin Urine: NEGATIVE
Glucose, UA: NEGATIVE mg/dL
HGB URINE DIPSTICK: NEGATIVE
Ketones, ur: NEGATIVE mg/dL
Leukocytes, UA: NEGATIVE
NITRITE: NEGATIVE
Protein, ur: NEGATIVE mg/dL
SPECIFIC GRAVITY, URINE: 1.011 (ref 1.005–1.030)
UROBILINOGEN UA: 0.2 mg/dL (ref 0.0–1.0)
pH: 6.5 (ref 5.0–8.0)

## 2013-07-03 LAB — BASIC METABOLIC PANEL
BUN: 17 mg/dL (ref 6–23)
CHLORIDE: 100 meq/L (ref 96–112)
CO2: 28 mEq/L (ref 19–32)
Calcium: 9.7 mg/dL (ref 8.4–10.5)
Creatinine, Ser: 1 mg/dL (ref 0.50–1.10)
GFR calc non Af Amer: 61 mL/min — ABNORMAL LOW (ref >90)
GFR, EST AFRICAN AMERICAN: 71 mL/min — AB (ref >90)
Glucose, Bld: 93 mg/dL (ref 70–99)
POTASSIUM: 3.7 meq/L (ref 3.7–5.3)
Sodium: 140 mEq/L (ref 137–147)

## 2013-07-03 LAB — TROPONIN I: Troponin I: <0.3 ng/mL (ref <0.30)

## 2013-07-03 NOTE — Discharge Instructions (Signed)
Aspirin and Your Heart °Aspirin affects the way your blood clots and helps "thin" the blood. Aspirin has many uses in heart disease. It may be used as a primary prevention to help reduce the risk of heart related events. It also can be used as a secondary measure to prevent more heart attacks or to prevent additional damage from blood clots.  °ASPIRIN MAY HELP IF YOU: °· Have had a heart attack or chest pain. °· Have undergone open heart surgery such as CABG (Coronary Artery Bypass Surgery). °· Have had coronary angioplasty with or without stents. °· Have experienced a stroke or TIA (transient ischemic attack). °· Have peripheral vascular disease (PAD). °· Have chronic heart rhythm problems such as atrial fibrillation. °· Are at risk for heart disease. °BEFORE STARTING ASPIRIN °Before you start taking aspirin, your caregiver will need to review your medical history. Many things will need to be taken into consideration, such as: °· Smoking status. °· Blood pressure. °· Diabetes. °· Gender. °· Weight. °· Cholesterol level. °ASPIRIN DOSES °· Aspirin should only be taken on the advice of your caregiver. Talk to your caregiver about how much aspirin you should take. Aspirin comes in different doses such as: °· 81 mg. °· 162 mg. °· 325 mg. °· The aspirin dose you take may be affected by many factors, some of which include: °· Your current medications, especially if your are taking blood-thinners or anti-platelet medicine. °· Liver function. °· Heart disease risk. °· Age. °· Aspirin comes in two forms: °· Non-enteric-coated. This type of aspirin does not have a coating and is absorbed faster. Non-enteric coated aspirin is recommended for patients experiencing chest pain symptoms. This type of aspirin also comes in a chewable form. °· Enteric-coated. This means the aspirin has a special coating that releases the medicine very slowly. Enteric-coated aspirin causes less stomach upset. This type of aspirin should not be chewed  or crushed. °ASPIRIN SIDE EFFECTS °Daily use of aspirin can increase your risk of serious side effects. Some of these include: °· Increased bleeding. This can range from a cut that does not stop bleeding to more serious problems such as stomach bleeding or bleeding into the brain (Intracerebral bleeding). °· Increased bruising. °· Stomach upset. °· An allergic reaction such as red, itchy skin. °· Increased risk of bleeding when combined with non-steroidal anti-inflammatory medicine (NSAIDS). °· Alcohol should be drank in moderation when taking aspirin. Alcohol can increase the risk of stomach bleeding when taken with aspirin. °· Aspirin should not be given to children less than 18 years of age due to the association of Reye syndrome. Reye syndrome is a serious illness that can affect the brain and liver. Studies have linked Reye syndrome with aspirin use in children. °· People that have nasal polyps have an increased risk of developing an aspirin allergy. °SEEK MEDICAL CARE IF:  °· You develop an allergic reaction such as: °· Hives. °· Itchy skin. °· Swelling of the lips, tongue or face. °· You develop stomach pain. °· You have unusual bleeding or bruising. °· You have ringing in your ears. °SEEK IMMEDIATE MEDICAL CARE IF:  °· You have severe chest pain, especially if the pain is crushing or pressure-like and spreads to the arms, back, neck, or jaw. THIS IS AN EMERGENCY. Do not wait to see if the pain will go away. Get medical help at once. Call your local emergency services (911 in the [place] DO NOT drive yourself to the hospital. °· You have stroke-like symptoms   such as: °· Loss of vision. °· Difficulty talking. °· Numbness or weakness on one side of your body. °· Numbness or weakness in your arm or leg. °· Not thinking clearly or feeling confused. °· Your bowel movements are bloody, dark red or black in color. °· You vomit or cough up blood. °· You have blood in your urine. °· You have shortness of breath,  coughing or wheezing. °MAKE SURE YOU:  °· Understand these instructions. °· Will monitor your condition. °· Seek immediate medical care if necessary. °Document Released: 05/12/2008 Document Revised: 09/24/2012 Document Reviewed: 05/12/2008 °[employer]® Patient Information ©2014 [employer]. °Chest Pain (Nonspecific) °It is often hard to give a specific diagnosis for the cause of chest pain. There is always a chance that your pain could be related to something serious, such as a heart attack or a blood clot in the lungs. You need to follow up with your caregiver for further evaluation. °CAUSES  °· Heartburn. °· Pneumonia or bronchitis. °· Anxiety or stress. °· Inflammation around your heart (pericarditis) or lung (pleuritis or pleurisy). °· A blood clot in the lung. °· A collapsed lung (pneumothorax). It can develop suddenly on its own (spontaneous pneumothorax) or from injury (trauma) to the chest. °· Shingles infection (herpes zoster virus). °The chest wall is composed of bones, muscles, and cartilage. Any of these can be the source of the pain. °· The bones can be bruised by injury. °· The muscles or cartilage can be strained by coughing or overwork. °· The cartilage can be affected by inflammation and become sore (costochondritis). °DIAGNOSIS  °Lab tests or other studies, such as X-rays, electrocardiography, stress testing, or cardiac imaging, may be needed to find the cause of your pain.  °TREATMENT  °· Treatment depends on what may be causing your chest pain. Treatment may include: °· Acid blockers for heartburn. °· Anti-inflammatory medicine. °· Pain medicine for inflammatory conditions. °· Antibiotics if an infection is present. °· You may be advised to change lifestyle habits. This includes stopping smoking and avoiding alcohol, caffeine, and chocolate. °· You may be advised to keep your head raised (elevated) when sleeping. This reduces the chance of acid going backward from your stomach into your  esophagus. °· Most of the time, nonspecific chest pain will improve within 2 to 3 days with rest and mild pain medicine. °HOME CARE INSTRUCTIONS  °· If antibiotics were prescribed, take your antibiotics as directed. Finish them even if you start to feel better. °· For the next few days, avoid physical activities that bring on chest pain. Continue physical activities as directed. °· Do not smoke. °· Avoid drinking alcohol. °· Only take over-the-counter or prescription medicine for pain, discomfort, or fever as directed by your caregiver. °· Follow your caregiver's suggestions for further testing if your chest pain does not go away. °· Keep any follow-up appointments you made. If you do not go to an appointment, you could develop lasting (chronic) problems with pain. If there is any problem keeping an appointment, you must call to reschedule. °SEEK MEDICAL CARE IF:  °· You think you are having problems from the medicine you are taking. Read your medicine instructions carefully. °· Your chest pain does not go away, even after treatment. °· You develop a rash with blisters on your chest. °SEEK IMMEDIATE MEDICAL CARE IF:  °· You have increased chest pain or pain that spreads to your arm, neck, jaw, back, or abdomen. °· You develop shortness of breath, an increasing cough, or you   are coughing up blood. °· You have severe back or abdominal pain, feel nauseous, or vomit. °· You develop severe weakness, fainting, or chills. °· You have a fever. °THIS IS AN EMERGENCY. Do not wait to see if the pain will go away. Get medical help at once. Call your local emergency services (911 in [place] Do not drive yourself to the hospital. °MAKE SURE YOU:  °· Understand these instructions. °· Will watch your condition. °· Will get help right away if you are not doing well or get worse. °Document Released: 03/09/2005 Document Revised: 08/22/2011 Document Reviewed: 01/03/2008 °[employer]® Patient Information ©2014 [employer]. ° °

## 2014-05-29 ENCOUNTER — Encounter (HOSPITAL_COMMUNITY): Payer: Self-pay | Admitting: Physician Assistant

## 2014-05-29 ENCOUNTER — Encounter (INDEPENDENT_AMBULATORY_CARE_PROVIDER_SITE_OTHER): Payer: Self-pay

## 2014-05-29 ENCOUNTER — Ambulatory Visit (INDEPENDENT_AMBULATORY_CARE_PROVIDER_SITE_OTHER): Payer: 59 | Admitting: Physician Assistant

## 2014-05-29 VITALS — BP 142/98 | HR 63 | Ht 64.5 in | Wt 180.0 lb

## 2014-05-29 DIAGNOSIS — F331 Major depressive disorder, recurrent, moderate: Secondary | ICD-10-CM

## 2014-05-29 DIAGNOSIS — F329 Major depressive disorder, single episode, unspecified: Secondary | ICD-10-CM

## 2014-05-29 MED ORDER — SERTRALINE HCL 50 MG PO TABS: 50.0000 mg | ORAL_TABLET | Freq: Every day | ORAL | Status: DC
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
## 2014-05-29 NOTE — Patient Instructions (Signed)
1. Continue all medication as ordered. 2. Call this office if you have any questions or concerns. 3. Continue to get regular exercise 3-5 times a week. 4. Continue to eat a healthy nutritionally balanced diet. 5. Continue to reduce stress and anxiety through activities such as yoga, mindfulness, meditation and or prayer. 6. Keep all appointments with your out patient therapist and have notes forwarded to this office. (If you do not have one and would like to be scheduled with a therapist, please let our office assist you with this. 7. Follow up as planned. 

## 2014-06-19 ENCOUNTER — Ambulatory Visit (HOSPITAL_COMMUNITY): Payer: Self-pay | Admitting: Physician Assistant

## 2014-12-09 ENCOUNTER — Other Ambulatory Visit (HOSPITAL_COMMUNITY): Payer: Self-pay | Admitting: Family Medicine

## 2014-12-09 ENCOUNTER — Other Ambulatory Visit (HOSPITAL_COMMUNITY): Payer: Self-pay | Admitting: Obstetrics & Gynecology

## 2014-12-09 DIAGNOSIS — Z1231 Encounter for screening mammogram for malignant neoplasm of breast: Secondary | ICD-10-CM

## 2014-12-17 ENCOUNTER — Ambulatory Visit (HOSPITAL_COMMUNITY): Payer: Self-pay

## 2015-02-06 ENCOUNTER — Telehealth: Payer: Self-pay | Admitting: Neurology

## 2015-02-06 NOTE — Telephone Encounter (Signed)
It does not appear this patient has been seen at [facility].  I called back.  Got no answer.  Left message.

## 2015-02-06 NOTE — Telephone Encounter (Signed)
Patient is calling to get a refill on medications but does not know the names because she is at work. I told the patient we needed the name of the medications. She said just to have someone call her. Thank you.

## 2015-02-19 ENCOUNTER — Other Ambulatory Visit: Payer: Self-pay | Admitting: Family Medicine

## 2015-02-19 ENCOUNTER — Ambulatory Visit
Admission: RE | Admit: 2015-02-19 | Discharge: 2015-02-19 | Disposition: A | Discharge status: Home / Self Care | Payer: 59 | Source: Ambulatory Visit | Attending: Family Medicine | Admitting: Family Medicine

## 2015-02-19 ENCOUNTER — Other Ambulatory Visit (HOSPITAL_COMMUNITY): Payer: Self-pay | Admitting: Obstetrics & Gynecology

## 2015-02-19 DIAGNOSIS — Z1231 Encounter for screening mammogram for malignant neoplasm of breast: Secondary | ICD-10-CM

## 2015-02-19 DIAGNOSIS — M25561 Pain in right knee: Secondary | ICD-10-CM

## 2015-02-28 ENCOUNTER — Other Ambulatory Visit: Payer: Self-pay | Admitting: Neurology

## 2015-03-11 ENCOUNTER — Ambulatory Visit (HOSPITAL_COMMUNITY)
Admission: RE | Admit: 2015-03-11 | Discharge: 2015-03-11 | Disposition: A | Discharge status: Home / Self Care | Payer: 59 | Source: Ambulatory Visit | Attending: Obstetrics & Gynecology | Admitting: Obstetrics & Gynecology

## 2015-03-11 DIAGNOSIS — Z1231 Encounter for screening mammogram for malignant neoplasm of breast: Secondary | ICD-10-CM | POA: Insufficient documentation

## 2015-04-22 ENCOUNTER — Institutional Professional Consult (permissible substitution): Payer: 59 | Admitting: Neurology

## 2015-05-11 ENCOUNTER — Telehealth: Payer: Self-pay

## 2015-05-11 ENCOUNTER — Institutional Professional Consult (permissible substitution): Payer: 59 | Admitting: Neurology

## 2015-05-11 NOTE — Telephone Encounter (Signed)
Pt was 20 mins late to her appt today. Dr. Vickey Hugerohmeier advised that pt would need to be rescheduled.

## 2015-05-12 ENCOUNTER — Encounter: Payer: Self-pay | Admitting: Neurology

## 2015-05-14 ENCOUNTER — Ambulatory Visit (INDEPENDENT_AMBULATORY_CARE_PROVIDER_SITE_OTHER): Payer: 59 | Admitting: Neurology

## 2015-05-14 ENCOUNTER — Encounter: Payer: Self-pay | Admitting: Neurology

## 2015-05-14 VITALS — BP 122/82 | HR 82 | Resp 20 | Ht 65.0 in | Wt 182.0 lb

## 2015-05-14 DIAGNOSIS — R5383 Other fatigue: Secondary | ICD-10-CM

## 2015-05-14 DIAGNOSIS — F32A Depression, unspecified: Secondary | ICD-10-CM

## 2015-05-14 DIAGNOSIS — F341 Dysthymic disorder: Secondary | ICD-10-CM

## 2015-05-14 DIAGNOSIS — G4719 Other hypersomnia: Secondary | ICD-10-CM | POA: Insufficient documentation

## 2015-05-14 DIAGNOSIS — F329 Major depressive disorder, single episode, unspecified: Secondary | ICD-10-CM | POA: Diagnosis not present

## 2015-05-14 DIAGNOSIS — G479 Sleep disorder, unspecified: Secondary | ICD-10-CM

## 2015-05-14 DIAGNOSIS — F418 Other specified anxiety disorders: Secondary | ICD-10-CM | POA: Insufficient documentation

## 2015-05-14 DIAGNOSIS — R0683 Snoring: Secondary | ICD-10-CM | POA: Insufficient documentation

## 2015-05-14 DIAGNOSIS — F5105 Insomnia due to other mental disorder: Secondary | ICD-10-CM | POA: Diagnosis not present

## 2015-05-14 MED ORDER — SUVOREXANT 15 MG PO TABS: 15.0000 mg | ORAL_TABLET | Freq: Every evening | ORAL | Status: DC

## 2015-05-14 NOTE — Progress Notes (Signed)
SLEEP MEDICINE CLINIC   Provider:  Melvyn Novasarmen  Carlester Kasparek, M D  Referring Provider: Bosie Closice, Kathleen M, MD Primary Care Physician:  Bosie ClosICE,KATHLEEN M, MD    HPI:  Sharon Barry is a 59 y.o. female , seen here as a referral from Dr. Dimple Caseyice for an insomnia evaluation,   Chief complaint according to patient : Patient of Dr. Bonnita HollowSater's, [facility], he prescribed Trazodone and Vystaril.  Sharon Barry reports that she underwent a sleep study on 07-20-11 at [facility] with Dr. Sherol DadeSeiter she was diagnosed with an AHI of 10 and an RDI of 13 indicative of rather mild apnea that would not necessarily be treated by CPAP. She also had oxygen desaturation. The study was further interpreted at documenting the significant snoring. She had prolonged periods of hypoxemia. She also complained of excessive daytime sleepiness and trouble to sleep at night. I'm able today to review the hepatogram from that original study. It shows that her pulse rate overnight actually declined and that her oxygen saturation improved after midnight. She had also endorsed the Epworth sleepiness score at 10 points.   The patient sees me today with a chief complaint of trouble to stay asleep.  Sleep habits are as follows: Her bedtime at night is 11 PM and she usually falls asleep with the help of trazodone and Vistaril, she usually is asleep within 30 minutes. Her bedroom is core, quiet and dark. Her husband had to move to another bedroom because she snores so loudly and it interrupted her sleep and kept her from falling asleep in the first place. The patient reports waking up between 3 AM and 5 AM regularly, she may stay awake for about 30 minutes rarely can she not go to sleep at all. Then she has at times overslept and has been tardy for work. She is supposed to rise at 7 AM and usually relies not on an alarm - the alarm ringing causes her anxiety. She rather watches the clock, from 3.30 am on. This keeps her  further from sleeping.  She reports racing thoughts at night.  She really rarely feels refreshed and restored in the morning. She struggles to get up. She never wakes up with a headache, she frequently has a dry mouth. This may be mediated by medication. The patient's husband has to be up at 5 AM.   Sleep medical history and family sleep history:  Patient has a history of chronic insomnia for a decade or longer this also has caused her to be excessively daytime sleepy. Patient had night terrors when she was a child but she did not sleep walk or sleep talk. Even her siblings report that she snored in childhood. Both parents snored.   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 Her husband rises earlier in the morning when she does, couple has definite different bedtime habits. She likes to drink caffeinated beverages but only in the morning she had recently had to stop that because of bladder irritation. She's not an alcohol drinker at all, she does not use any tobacco products. Married, living with her husband, no children in the home- one grandchild. .   Review of Systems: Out of a complete 14 system review, the patient complains of only the following symptoms, and all other reviewed systems are negative. Sharon Barry endorsed allergies with runny nose  congested nose rhinitis skin sensitivity she states that she has anxiety she feels she doesn't get enough sleep. She complains of racing thoughts only at night, memory loss insomnia, sleepiness and restless legs at times. She also has gained weight, feels excessively fatigued and has had itching which kept her from sleeping. She has a past medical history of  hypercholesterolemia, chronic and anxiety realted insomnia, depression.  Epworth score 8  , Fatigue severity score 37  ,  geriatric  depression score 5. See Phq 9 below.    Social  History   Social History  . Marital Status: Married    Spouse Name: N/A  . Number of Children: 3  . Years of Education: N/A   Occupational History  . Social Worker [employer]   Social History Main Topics  . Smoking status: Former Smoker -- 0.50 packs/day for 20 years    Types: Cigarettes    Quit date: 06/13/1993  . Smokeless tobacco: Never Used  . Alcohol Use: No  . Drug Use: Not on file  . Sexual Activity: Not on file   Other Topics Concern  . Not on file   Social History Narrative    Family History  Problem Relation Age of Onset  . Lung cancer Father     smoked  . Stomach cancer Mother     cause of death  . Lung cancer Brother     smoked  . Lung cancer Maternal Grandfather     smoked  . Bone cancer Maternal Grandmother   . Allergies Mother     Past Medical History  Diagnosis Date  . Urticaria, unspecified     insects, chinese food  . Esophageal reflux   . Unspecified asthma(493.90)     mild reversable obst small airways  . Allergic rhinitis, cause unspecified     skin test pos 10/18/06  . Depression   . Hyperlipidemia   . OSA (obstructive sleep apnea)     Past Surgical History  Procedure Laterality Date  . Vesicovaginal fistula closure w/ tah    . Tubal ligation    . Tonsillectomy  2012    Current Outpatient Prescriptions  Medication Sig Dispense Refill  . albuterol (PROVENTIL HFA;VENTOLIN HFA) 108 (90 BASE) MCG/ACT inhaler Inhale 1-2 puffs into the lungs every 6 (six) hours as needed for wheezing or shortness of breath.    . Ascorbic Acid (VITAMIN C) 1000 MG tablet Take 1,000 mg by mouth daily.      . Cholecalciferol (VITAMIN D3) 1000 UNITS CAPS Take 1 capsule by mouth daily.      . fluticasone (FLONASE) 50 MCG/ACT nasal spray Place 1 spray into both nostrils 2 (two) times daily.    . hydrOXYzine (ATARAX/VISTARIL) 25 MG tablet 1 at bedtime    . Multiple Vitamin (MULTIVITAMIN) capsule Take 1 capsule by mouth daily.      . Red Yeast Rice 600 MG  CAPS Take 1 capsule by mouth daily.      . sertraline (ZOLOFT) 50 MG tablet Take 1 tablet (50 mg total) by mouth daily. 30 tablet 2  . traZODone (DESYREL) 100 MG tablet 1/2 to 1 tablet at bedtime    . traZODone (DESYREL) 150 MG tablet Take 150 mg by mouth at bedtime.     No current facility-administered medications for this visit.    Allergies as of 05/14/2015 - Review Complete 05/14/2015  Allergen Reaction Noted  . Codeine  05/14/2015  . Fluticasone-salmeterol  02/15/2007  . Sulfonamide derivatives  02/15/2007  .  Hydrocodone-acetaminophen Rash 05/14/2015    Vitals: BP 122/82 mmHg  Pulse 82  Resp 20  Ht 5\' 5"  (1.651 m)  Wt 182 lb (82.555 kg)  BMI 30.29 kg/m2 Last Weight:  Wt Readings from Last 1 Encounters:  05/14/15 182 lb (82.555 kg)   ZOX:WRUE mass index is 30.29 kg/(m^2).     Last Height:   Ht Readings from Last 1 Encounters:  05/14/15 5\' 5"  (1.651 m)    Physical exam:  General: The patient is awake, alert and appears drowsy, moody- not in acute distress. The patient is  groomed. Head: Normocephalic, atraumatic. Neck is supple. Mallampati 4  neck circumference: 16.5 . Nasal airflow restricted , TMJ click  evident .  The patient used for braces and now has a permanent retainer. She would not tolerate an oral mandibular device she was told by her dentist. Harlow Asa is seen.  Tinnitus and ear pressure.  Cardiovascular:  Regular rate and rhythm, without  murmurs or carotid bruit, and without distended neck veins. Respiratory: Lungs are clear to auscultation. Skin:  Ankle  edema, no rash Trunk: BMI is elevated . The patient's posture is poor.   Neurologic exam : The patient is awake and alert, oriented to place and time.   Memory subjective  described as impaired  Memory testing deferred.  Attention span & concentration ability appears normal.  Speech is fluent,  without  dysarthria, dysphonia or aphasia.  Mood and affect are appropriate.  Cranial  nerves: Pupils are equal and briskly reactive to light. Funduscopic exam without  evidence of pallor or edema.  Extraocular movements  in vertical and horizontal planes intact and without nystagmus. Visual fields by finger perimetry are intact. Hearing to finger rub intact.   Facial sensation intact to fine touch.  Facial motor strength is symmetric and tongue and uvula move midline. Shoulder shrug was symmetrical.   Motor exam:   Normal tone, muscle bulk and symmetric strength in all extremities. Sharon Barry reports that she has begun dropping objects without feeling that they slipped from her hands but her grip strength is strong.  Sensory:  Fine touch, pinprick and vibration were tested in all extremities. Proprioception tested in the upper extremities was normal.  Coordination: Rapid alternating movements in the fingers/hands was normal. Finger-to-nose maneuver  normal without evidence of ataxia, dysmetria or tremor.  Gait and station: Patient walks without assistive device and is able unassisted to climb up to the exam table. Strength within normal limits.  Stance is stable and normal.  Toe and heel stand were tested . Tandem gait is unfragmented. Turns with 3 Steps. Romberg testing is negative.  Deep tendon reflexes: in the  upper and lower extremities are symmetric and intact. Babinski maneuver response is down going.  The patient was advised of the nature of the diagnosed sleep disorder , the treatment options and risks for general a health and wellness arising from not treating the condition.  I spent more than 45 minutes of face to face time with the patient. Greater than 50% of time was spent in counseling and coordination of care. We have discussed the diagnosis and differential and I answered the patient's questions.     Assessment:  After physical and neurologic examination, review of laboratory studies,  Personal review of imaging studies, reports of other /same  Imaging  studies ,  Results of polysomnography/ neurophysiology testing and pre-existing records as far as provided in visit., my assessment is   1) Sharon Barry has a known  diagnosis of mild sleep apnea with some sleep associated hypoxemia. It is possible that this contributes to her fatigue and sleepiness in daytime but my suspicion is that her poor sleep quality of night is the main cause.  2) Sharon Barry has a history of chronic insomnia, sometimes she is so concerned with her sleep is that she has developed some counterproductive behaviors such as looking at the clock permanently or repeatedly at night. She states that she has anxiety almost panic if an alarm rings and therefore tries not to rely on it. Depression and anxiety will play a role in it given that she is treated with trazodone and Vistaril she has developed a dry mouth in the morning which is a common side effect. Her concern is that permanently taking trazodone may affect her memory and may have given her a hangover effect the grogginess in daytime. There is a distinct possibility. I would usually preferred the patient to use a shorter acting sleep aid and instead use an SSRI antidepressant in daytime to decrease anxiety and a tendency to worry and have ruminating thoughts at night.  3) Sharon Barry has a childhood history of snoring but seems to have never completely gone away. She has a wide open upper airway but her airway is short. She often has rhinitis which forces her to breathe through the mouth and promote snoring and could also contribute to sleep apnea.    Plan:  Treatment plan and additional workup :  I will offer the patient belts some samples or a coupon card. I would strongly suggest to start on an antidepressant to eliminate the early morning arousals and the feeling of being continually worried about oversleeping. I suggested that instead of using a sound alarm the patient may try a sharper image device that projects  lights to the bedroom ceiling when waking her up she could also set her smart phone with an alarm and a pleasant sound. One of the applications frequently used is CAL M, and it helps to fall asleep as well. She may benefit from medication in the evening to set her mind ready for sleep. She should also take a hot bath or hot shower before going to bed which will help her to relax. She should not exercise within the hour of going to bed. She should have her last meal more than 2 hours before going to bed.  I advised her to try ZOLOFT, and gave her a prescription- she can follow with Dr Epimenio Foot in the next 3 month.    Her established sleep physician, Dr Epimenio Foot , is now practicing from this office and the patient will follow up with him.   Sharon Mylar Shatira Dobosz MD  05/14/2015   CC: Bosie Clos, Md [address] [place], [place] [address]  CC Dr Despina Arias.

## 2015-05-14 NOTE — Addendum Note (Signed)
Addended by: Melvyn NovasHMEIER, Camara Renstrom on: 05/14/2015 12:34 PM   Modules accepted: Orders

## 2015-05-14 NOTE — Patient Instructions (Addendum)
Please take your Zoloft in the morning and not in the evening as he used to do. You currently taking 50 mg at night. Keep the same dose but take it in the morning.  I like for you to reduce the trazodone or discontinue it. Trazodone is not a habit forming medication but it does cause grogginess.   I will give you a prescription for BELSOMRA. Suvorexant oral tablets What is this medicine? SUVOREXANT (su-vor-EX-ant) is used to treat insomnia. This medicine helps you to fall asleep and sleep through the night. This medicine may be used for other purposes; ask your health care provider or pharmacist if you have questions. What should I tell my health care provider before I take this medicine? They need to know if you have any of these conditions: -depression -history of a drug or alcohol abuse problem -history of daytime sleepiness -history of sudden onset of muscle weakness (cataplexy) -liver disease -lung or breathing disease -narcolepsy -suicidal thoughts, plans, or attempt; a previous suicide attempt by you or a family member -an unusual or allergic reaction to suvorexant, other medicines, foods, dyes, or preservatives -pregnant or trying to get pregnant -breast-feeding How should I use this medicine? Take this medicine by mouth within 30 minutes of going to bed. Do not take it unless you are able to stay in bed a full night before you must be active again. Follow the directions on the prescription label. For best results, it is better to take this medicine on an empty stomach. Do not take your medicine more often than directed. Do not stop taking this medicine on your own. Always follow your doctor or health care professional's advice. A special MedGuide will be given to you by the pharmacist with each prescription and refill. Be sure to read this information carefully each time. Talk to your pediatrician regarding the use of this medicine in children. Special care may be  needed. Overdosage: If you think you have taken too much of this medicine contact a poison control center or emergency room at once. NOTE: This medicine is only for you. Do not share this medicine with others. What if I miss a dose? This medicine should only be taken immediately before going to sleep. Do not take double or extra doses. What may interact with this medicine? -alcohol -antiviral medicines for HIV or AIDS -aprepitant -carbamazepine -certain antibiotics like ciprofloxacin, clarithromycin, erythromycin, telithromycin -certain medicines for depression or psychotic disturbances -certain medicines for fungal infections like ketoconazole, posaconazole, fluconazole, or itraconazole -conivaptan -digoxin -diltiazem -grapefruit juice -imatinib -medicines for anxiety or sleep -phenytoin -rifampin -verapamil This list may not describe all possible interactions. Give your health care provider a list of all the medicines, herbs, non-prescription drugs, or dietary supplements you use. Also tell them if you smoke, drink alcohol, or use illegal drugs. Some items may interact with your medicine. What should I watch for while using this medicine? Visit your doctor or health care professional for regular checks on your progress. Keep a regular sleep schedule by going to bed at about the same time each night. Avoid caffeine-containing drinks in the evening hours. When sleep medicines are used every night for more than a few weeks, they may stop working. Do not increase the dose on your own. Talk to your doctor if your insomnia worsens or is not better within 7 to 10 days. After taking this medicine for sleep, you may get up out of bed while not being fully awake and do an activity that  you do not know you are doing. The next morning, you may have no memory of the event. Activities such as driving a car ("sleep-driving"), making and eating food, talking on the phone, sexual activity, and  sleep-walking have been reported. Call your doctor right away if you find out you have done any of these activities. Do not take this medicine if you have used alcohol that evening or before bed or taken another medicine for sleep, since your risk of doing these sleep-related activities will be increased. Do not take this medicine unless you are able to stay in bed for a full night (7 to 8 hours) and do not drive or perform other activities requiring full alertness within 8 hours of a dose. Do not drive, use machinery, or do anything that needs mental alertness the day after you take the 20 mg dose of this medicine. The use of lower doses (10 mg) also has the potential to cause driving impairment the next day. You may have a decrease in mental alertness the day after use, even if you feel that you are fully awake. Tell your doctor if you will need to perform activities requiring full alertness, such as driving, the next day. Do not stand or sit up quickly after taking this medicine, especially if you are an older patient. This reduces the risk of dizzy or fainting spells. If you or your family notice any changes in your behavior, such as new or worsening depression, thoughts of harming yourself, anxiety, other unusual or disturbing thoughts, or memory loss, call your doctor right away. What side effects may I notice from receiving this medicine? Side effects that you should report to your doctor or health care professional as soon as possible: -allergic reactions like skin rash, itching or hives, swelling of the face, lips, or tongue -confusion -depressed mood -feeling faint or lightheaded, falls -hallucinations -inability to move or speak for up to several minutes while you are going to sleep or waking up -memory loss -periods of leg weakness lasting from seconds to a few minutes -problems with balance, speaking, walking -restlessness, excitability, or feelings of agitation -unusual activities while  asleep like driving, eating, making phone calls Side effects that usually do not require medical attention (Report these to your doctor or health care professional if they continue or are bothersome.): -abnormal dreams -daytime drowsiness -diarrhea -dizziness -headache This list may not describe all possible side effects. Call your doctor for medical advice about side effects. You may report side effects to FDA at 1-800-FDA-1088. Where should I keep my medicine? Keep out of the reach of children. This medicine can be abused. Keep your medicine in a safe place to protect it from theft. Do not share this medicine with anyone. Selling or giving away this medicine is dangerous and against the law. Store at room temperature between 15 and 30 degrees C (59 and 86 degrees F). Throw away any unused medicine after the expiration date. NOTE: This sheet is a summary. It may not cover all possible information. If you have questions about this medicine, talk to your doctor, pharmacist, or health care provider.    2016, Elsevier/Gold Standard. (2014-11-17 13:22:51)     Rv after  HST with Dr Epimenio Foot,

## 2015-05-25 ENCOUNTER — Other Ambulatory Visit: Payer: Self-pay | Admitting: Neurology

## 2015-05-27 ENCOUNTER — Other Ambulatory Visit: Payer: Self-pay

## 2015-05-27 DIAGNOSIS — G4719 Other hypersomnia: Secondary | ICD-10-CM

## 2015-05-27 DIAGNOSIS — F418 Other specified anxiety disorders: Secondary | ICD-10-CM

## 2015-05-27 DIAGNOSIS — R5383 Other fatigue: Principal | ICD-10-CM

## 2015-05-27 DIAGNOSIS — F329 Major depressive disorder, single episode, unspecified: Secondary | ICD-10-CM

## 2015-05-27 DIAGNOSIS — F32A Depression, unspecified: Secondary | ICD-10-CM

## 2015-05-27 DIAGNOSIS — R0683 Snoring: Secondary | ICD-10-CM

## 2015-05-27 DIAGNOSIS — G479 Sleep disorder, unspecified: Secondary | ICD-10-CM

## 2015-05-27 DIAGNOSIS — F5105 Insomnia due to other mental disorder: Secondary | ICD-10-CM

## 2015-05-27 MED ORDER — SUVOREXANT 15 MG PO TABS: 15.0000 mg | ORAL_TABLET | Freq: Every evening | ORAL | Status: DC

## 2015-05-27 NOTE — Telephone Encounter (Signed)
Per pharmacy, patient requests 30 day Rx

## 2015-07-06 ENCOUNTER — Telehealth: Payer: Self-pay | Admitting: Neurology

## 2015-07-06 NOTE — Telephone Encounter (Signed)
Patient is having problems with her medication Belsomra with having bad dreams with it. She is wondering if Dr. Vickey Huger can switch her medicine. The best number to contact is [phone]

## 2015-07-06 NOTE — Telephone Encounter (Signed)
Called to discuss. Dr. Vickey Huger had instructed pt to follow up with Dr. Epimenio Foot, pt's previous sleep physician. It also appears that pt has not completed her HST yet.  Left a message asking her to call me back.

## 2015-07-08 NOTE — Telephone Encounter (Signed)
Called pt again to discuss Belsomra and ask her to complete the HST. No answer, left a message asking her to call me back.

## 2015-07-09 NOTE — Telephone Encounter (Signed)
I spoke to Dr. Vickey Huger. Dr. Vickey Huger advised me to tell the pt to stop taking the belsomra. She needs to complete her HST and have it read by Dr. Epimenio Foot. Dr. Vickey Huger is not comfortable prescribing another sleep aid at this time.  I called pt back to discuss. No answer. Left a message asking her to call me back.

## 2015-07-09 NOTE — Telephone Encounter (Signed)
Pt returned Kristen's call °

## 2015-07-09 NOTE — Telephone Encounter (Signed)
Patient returned Kristen's call, please call back to [phone].

## 2015-07-10 NOTE — Telephone Encounter (Signed)
Spoke to patient. Gave message per Kristen's previous note. Patient verbalized understanding.  HST is scheduled on 08/04/15.

## 2015-08-04 ENCOUNTER — Encounter (INDEPENDENT_AMBULATORY_CARE_PROVIDER_SITE_OTHER): Payer: 59 | Admitting: Neurology

## 2015-08-04 DIAGNOSIS — F5105 Insomnia due to other mental disorder: Secondary | ICD-10-CM

## 2015-08-04 DIAGNOSIS — G4710 Hypersomnia, unspecified: Secondary | ICD-10-CM

## 2015-08-04 DIAGNOSIS — F329 Major depressive disorder, single episode, unspecified: Secondary | ICD-10-CM

## 2015-08-04 DIAGNOSIS — G4719 Other hypersomnia: Secondary | ICD-10-CM

## 2015-08-04 DIAGNOSIS — R0683 Snoring: Secondary | ICD-10-CM

## 2015-08-04 DIAGNOSIS — G479 Sleep disorder, unspecified: Secondary | ICD-10-CM

## 2015-08-04 DIAGNOSIS — F418 Other specified anxiety disorders: Secondary | ICD-10-CM

## 2015-08-04 DIAGNOSIS — R5383 Other fatigue: Principal | ICD-10-CM

## 2015-08-04 DIAGNOSIS — F32A Depression, unspecified: Secondary | ICD-10-CM

## 2015-08-20 ENCOUNTER — Telehealth: Payer: Self-pay

## 2015-08-20 DIAGNOSIS — G4733 Obstructive sleep apnea (adult) (pediatric): Secondary | ICD-10-CM

## 2015-08-20 NOTE — Telephone Encounter (Signed)
I spoke to pt and advised her that her study revealed hypoxemia and mild apnea and treatment is advised. An in lab study is needed to verify the presence of apnea in relation to hypoxemia. PAP therapy is likely indicated, and Dr. Hulen Lusterohmeeir recommends proceeding with an in lab cpap titration. The first hour will be used to verify findings of HST and the the cpap will be applied and oxygen titration if needed. Pt is willing to proceed. I advised her that our sleep lab would call to get her scheduled. Pt verbalized understanding.

## 2015-08-26 ENCOUNTER — Telehealth: Payer: Self-pay | Admitting: Neurology

## 2015-08-26 NOTE — Telephone Encounter (Signed)
Can we use a PA{P nap to have observed time on CPAP. ??

## 2015-08-26 NOTE — Telephone Encounter (Signed)
[facility]

## 2016-03-21 ENCOUNTER — Other Ambulatory Visit: Payer: Self-pay | Admitting: Obstetrics & Gynecology

## 2016-03-21 DIAGNOSIS — Z1231 Encounter for screening mammogram for malignant neoplasm of breast: Secondary | ICD-10-CM

## 2016-04-19 ENCOUNTER — Ambulatory Visit
Admission: RE | Admit: 2016-04-19 | Discharge: 2016-04-19 | Disposition: A | Discharge status: Home / Self Care | Payer: 59 | Source: Ambulatory Visit | Attending: Obstetrics & Gynecology | Admitting: Obstetrics & Gynecology

## 2016-04-19 DIAGNOSIS — Z1231 Encounter for screening mammogram for malignant neoplasm of breast: Secondary | ICD-10-CM

## 2016-07-12 ENCOUNTER — Emergency Department (HOSPITAL_BASED_OUTPATIENT_CLINIC_OR_DEPARTMENT_OTHER)
Admission: EM | Admit: 2016-07-12 | Discharge: 2016-07-12 | Disposition: A | Discharge status: Home / Self Care | Payer: 59 | Attending: Emergency Medicine | Admitting: Emergency Medicine

## 2016-07-12 ENCOUNTER — Encounter (HOSPITAL_BASED_OUTPATIENT_CLINIC_OR_DEPARTMENT_OTHER): Payer: Self-pay | Admitting: Emergency Medicine

## 2016-07-12 ENCOUNTER — Emergency Department (HOSPITAL_BASED_OUTPATIENT_CLINIC_OR_DEPARTMENT_OTHER): Payer: 59

## 2016-07-12 DIAGNOSIS — R1011 Right upper quadrant pain: Secondary | ICD-10-CM | POA: Diagnosis not present

## 2016-07-12 DIAGNOSIS — Z87891 Personal history of nicotine dependence: Secondary | ICD-10-CM | POA: Diagnosis not present

## 2016-07-12 DIAGNOSIS — R197 Diarrhea, unspecified: Secondary | ICD-10-CM | POA: Diagnosis not present

## 2016-07-12 DIAGNOSIS — R112 Nausea with vomiting, unspecified: Secondary | ICD-10-CM

## 2016-07-12 DIAGNOSIS — Z79899 Other long term (current) drug therapy: Secondary | ICD-10-CM | POA: Insufficient documentation

## 2016-07-12 DIAGNOSIS — J45909 Unspecified asthma, uncomplicated: Secondary | ICD-10-CM | POA: Insufficient documentation

## 2016-07-12 LAB — CBC WITH DIFFERENTIAL/PLATELET
BASOS ABS: 0 10*3/uL (ref 0.0–0.1)
Basophils Relative: 0 %
EOS PCT: 0 %
Eosinophils Absolute: 0 10*3/uL (ref 0.0–0.7)
HEMATOCRIT: 45.1 % (ref 36.0–46.0)
Hemoglobin: 14.6 g/dL (ref 12.0–15.0)
LYMPHS PCT: 4 %
Lymphs Abs: 0.7 10*3/uL (ref 0.7–4.0)
MCH: 30.1 pg (ref 26.0–34.0)
MCHC: 32.4 g/dL (ref 30.0–36.0)
MCV: 93 fL (ref 78.0–100.0)
MONO ABS: 0.6 10*3/uL (ref 0.1–1.0)
MONOS PCT: 3 %
NEUTROS ABS: 15.4 10*3/uL — AB (ref 1.7–7.7)
Neutrophils Relative %: 93 %
PLATELETS: 279 10*3/uL (ref 150–400)
RBC: 4.85 MIL/uL (ref 3.87–5.11)
RDW: 13.7 % (ref 11.5–15.5)
WBC: 16.7 10*3/uL — ABNORMAL HIGH (ref 4.0–10.5)

## 2016-07-12 LAB — URINALYSIS, ROUTINE W REFLEX MICROSCOPIC
Bilirubin Urine: NEGATIVE
Glucose, UA: NEGATIVE mg/dL
Hgb urine dipstick: NEGATIVE
Ketones, ur: NEGATIVE mg/dL
LEUKOCYTES UA: NEGATIVE
NITRITE: NEGATIVE
PROTEIN: NEGATIVE mg/dL
SPECIFIC GRAVITY, URINE: 1.018 (ref 1.005–1.030)
pH: 7 (ref 5.0–8.0)

## 2016-07-12 LAB — COMPREHENSIVE METABOLIC PANEL
ALT: 29 U/L (ref 14–54)
AST: 33 U/L (ref 15–41)
Albumin: 4.4 g/dL (ref 3.5–5.0)
Alkaline Phosphatase: 80 U/L (ref 38–126)
Anion gap: 10 (ref 5–15)
BILIRUBIN TOTAL: 0.6 mg/dL (ref 0.3–1.2)
BUN: 13 mg/dL (ref 6–20)
CHLORIDE: 107 mmol/L (ref 101–111)
CO2: 22 mmol/L (ref 22–32)
Calcium: 9.5 mg/dL (ref 8.9–10.3)
Creatinine, Ser: 0.79 mg/dL (ref 0.44–1.00)
GFR calc Af Amer: >60 mL/min (ref >60)
Glucose, Bld: 143 mg/dL — ABNORMAL HIGH (ref 65–99)
POTASSIUM: 4 mmol/L (ref 3.5–5.1)
Sodium: 139 mmol/L (ref 135–145)
TOTAL PROTEIN: 7.3 g/dL (ref 6.5–8.1)

## 2016-07-12 LAB — LIPASE, BLOOD: Lipase: 25 U/L (ref 11–51)

## 2016-07-12 MED ORDER — SODIUM CHLORIDE 0.9 % IV BOLUS (SEPSIS)
1000.0000 mL | Freq: Once | INTRAVENOUS | Status: AC
  Administered 2016-07-12: 1000 mL via INTRAVENOUS

## 2016-07-12 MED ORDER — MORPHINE SULFATE (PF) 4 MG/ML IV SOLN
4.0000 mg | Freq: Once | INTRAVENOUS | Status: AC
  Administered 2016-07-12: 4 mg via INTRAVENOUS
  Filled 2016-07-12: qty 1

## 2016-07-12 MED ORDER — ONDANSETRON HCL 4 MG/2ML IJ SOLN
4.0000 mg | Freq: Once | INTRAMUSCULAR | Status: AC
  Administered 2016-07-12: 4 mg via INTRAVENOUS
  Filled 2016-07-12: qty 2

## 2016-07-12 MED ORDER — ONDANSETRON 4 MG PO TBDP
ORAL_TABLET | ORAL | 0 refills | Status: DC
Start: 2016-07-12 — End: 2017-01-12

## 2016-07-12 MED FILL — ONDANSETRON ODT 4 MG TABLET: 4 | 4 days supply | Qty: 20 | Fill #0

## 2016-07-12 NOTE — ED Triage Notes (Signed)
Middle abd pain with N/V/D since 3 this morning.

## 2016-07-12 NOTE — ED Provider Notes (Signed)
[facility]   CSN: [id number] Arrival date & time: 07/12/16  4098     History   Chief Complaint Chief Complaint  Patient presents with  . Abdominal Pain    HPI Sharon Barry is a 61 y.o. female.  61 yo F with a cc of abdominal pain. Going on since 2am, woke her from sleep. Associated with vomiting and diarrhea. Denies fevers. Patient just got over the flu and is taking Tamiflu to its finish. Feels that her abdominal pain is around the bellybutton but is moving towards the right side. Denies bloody or green vomit.   The history is provided by the patient.  Abdominal Pain   This is a new problem. The current episode started 6 to 12 hours ago. The problem occurs constantly. The problem has not changed since onset.The pain is located in the RUQ. The pain is at a severity of 9/10. The pain is severe. Associated symptoms include diarrhea, nausea and vomiting. Pertinent negatives include fever, dysuria, headaches, arthralgias and myalgias. Nothing aggravates the symptoms. Nothing relieves the symptoms.    Past Medical History:  Diagnosis Date  . Allergic rhinitis, cause unspecified    skin test pos 10/18/06  . Depression   . Esophageal reflux   . Hyperlipidemia   . OSA (obstructive sleep apnea)   . Unspecified asthma(493.90)    mild reversable obst small airways  . Urticaria, unspecified    insects, chinese food    Patient Active Problem List   Diagnosis Date Noted  . 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 Snoring 05/14/2015  . Excessive daytime sleepiness 05/14/2015  . Depression (emotion) 05/14/2015  . Fatigue due to sleep pattern disturbance 05/14/2015  . Cough 07/20/2011  . ALLERGIC RHINITIS 02/15/2007  . ASTHMA 02/15/2007  . GERD 02/15/2007  . URTICARIA 02/15/2007    Past Surgical History:  Procedure Laterality Date  . TONSILLECTOMY  2012  . TUBAL LIGATION    . VESICOVAGINAL FISTULA CLOSURE W/ TAH      OB  History    No data available       Home Medications    Prior to Admission medications   Medication Sig Start Date End Date Taking? Authorizing Provider  albuterol (PROVENTIL HFA;VENTOLIN HFA) 108 (90 BASE) MCG/ACT inhaler Inhale 1-2 puffs into the lungs every 6 (six) hours as needed for wheezing or shortness of breath.   Yes Historical Provider, MD  hydrOXYzine (ATARAX/VISTARIL) 25 MG tablet 1 at bedtime 05/24/11  Yes Historical Provider, MD  Ascorbic Acid (VITAMIN C) 1000 MG tablet Take 1,000 mg by mouth daily.      Historical Provider, MD  Cholecalciferol (VITAMIN D3) 1000 UNITS CAPS Take 1 capsule by mouth daily.      Historical Provider, MD  fluticasone (FLONASE) 50 MCG/ACT nasal spray Place 1 spray into both nostrils 2 (two) times daily.    Historical Provider, MD  Multiple Vitamin (MULTIVITAMIN) capsule Take 1 capsule by mouth daily.      Historical Provider, MD  ondansetron (ZOFRAN ODT) 4 MG disintegrating tablet 4mg  ODT q4 hours prn nausea/vomit 07/12/16   Melene Plan, DO  Red Yeast Rice 600 MG CAPS Take 1 capsule by mouth daily.      Historical Provider, MD  sertraline (ZOLOFT) 50 MG tablet Take 1 tablet (50 mg total) by mouth daily. 05/29/14 05/29/15  Lloyd Huger T Mashburn, PA-C  Suvorexant (BELSOMRA) 15 MG TABS Take 15 mg by mouth Nightly. 05/27/15   Melvyn Novas, MD  traZODone (DESYREL) 150 MG tablet Take 150 mg by mouth at bedtime.    Historical Provider, MD    Family History Family History  Problem Relation Age of Onset  . Lung cancer Father     smoked  . Stomach cancer Mother     cause of death  . Allergies Mother   . Lung cancer Brother     smoked  . Lung cancer Maternal Grandfather     smoked  . Bone cancer Maternal Grandmother     Social History Social History  Substance Use Topics  . Smoking status: Former Smoker    Packs/day: 0.50    Years: 20.00    Types: Cigarettes    Quit date: 06/13/1993  . Smokeless tobacco: Never Used  . Alcohol use No      Allergies   Codeine; Fluticasone-salmeterol; Sulfonamide derivatives; and Hydrocodone-acetaminophen   Review of Systems Review of Systems  Constitutional: Negative for chills and fever.  HENT: Negative for congestion and rhinorrhea.   Eyes: Negative for redness and visual disturbance.  Respiratory: Negative for shortness of breath and wheezing.   Cardiovascular: Negative for chest pain and palpitations.  Gastrointestinal: Positive for abdominal pain, diarrhea, nausea and vomiting.  Genitourinary: Negative for dysuria and urgency.  Musculoskeletal: Negative for arthralgias and myalgias.  Skin: Negative for pallor and wound.  Neurological: Negative for dizziness and headaches.     Physical Exam Updated Vital Signs BP 113/71 (BP Location: Left Arm)   Pulse 72   Temp 98.1 F (36.7 C) (Oral)   Resp 16   Ht 5\' 1"  (1.549 m)   Wt 180 lb (81.6 kg)   SpO2 98%   BMI 34.01 kg/m   Physical Exam  Constitutional: She is oriented to person, place, and time. She appears well-developed and well-nourished. No distress.  HENT:  Head: Normocephalic and atraumatic.  Eyes: EOM are normal. Pupils are equal, round, and reactive to light.  Neck: Normal range of motion. Neck supple.  Cardiovascular: Normal rate and regular rhythm.  Exam reveals no gallop and no friction rub.   No murmur heard. Pulmonary/Chest: Effort normal. She has no wheezes. She has no rales.  Abdominal: Soft. She exhibits no distension and no mass. There is tenderness (diffuse, worst to RUQ). There is no rebound and no guarding.  Musculoskeletal: She exhibits no edema or tenderness.  Neurological: She is alert and oriented to person, place, and time.  Skin: Skin is warm and dry. She is not diaphoretic.  Psychiatric: She has a normal mood and affect. Her behavior is normal.  Nursing note and vitals reviewed.    ED Treatments / Results  Labs (all labs ordered are listed, but only abnormal results are  displayed) Labs Reviewed  CBC WITH DIFFERENTIAL/PLATELET - Abnormal; Notable for the following:       Result Value   WBC 16.7 (*)    Neutro Abs 15.4 (*)    All other components within normal limits  COMPREHENSIVE METABOLIC PANEL - Abnormal; Notable for the following:    Glucose, Bld 143 (*)    All other components within normal limits  URINALYSIS, ROUTINE W REFLEX MICROSCOPIC  LIPASE, BLOOD    EKG  EKG Interpretation None       Radiology US Abdomen Limited Ruq  Result Date: 07/12/2016 CLINICAL DATA:  Epigastric and right upper quadrant pain today, some nausea, vomiting and diarrhea EXAM: US ABDOMEN LIMITED - RIGHT UPPER QUADRANT COMPARISON:  Ultrasound of the abdomen of 02/11/2004 FINDINGS: Gallbladder: The gallbladder  is visualized and no gallstones are noted. There is some gallbladder sludge present. No pain is present over the gallbladder with compression. Common bile duct: Diameter: The common bile duct is normal measuring 5 mm in diameter. Liver: The liver has a normal echogenic pattern. No focal hepatic abnormality is seen. Hypoechoic structures are noted in the right kidney most consistent with small incidental cysts but difficult to assess on this limited exam. IMPRESSION: 1. Negative limited ultrasound of the upper abdomen.  No gallstones. 2. Probable incidental small right renal cysts, not well seen on this limited exam. Electronically Signed   By: Dwyane DeePaul  Barry M.D.   On: 07/12/2016 11:33    Procedures Procedures (including critical care time)  Medications Ordered in ED Medications  sodium chloride 0.9 % bolus 1,000 mL (0 mLs Intravenous Stopped 07/12/16 1152)  morphine 4 MG/ML injection 4 mg (4 mg Intravenous Given 07/12/16 1021)  ondansetron (ZOFRAN) injection 4 mg (4 mg Intravenous Given 07/12/16 1021)     Initial Impression / Assessment and Plan / ED Course  I have reviewed the triage vital signs and the nursing notes.  Pertinent labs & imaging results that were  available during my care of the patient were reviewed by me and considered in my medical decision making (see chart for details).     61 yo F With a chief complaint of abdominal pain vomiting diarrhea. This may be a complication of the Tamiflu however patient has significant tenderness to the right upper quadrant was obtain an ultrasound.   Ultrasound negative. On repeat exam patient is feeling much better. She is able to tolerate fluids without difficulty. Discharge home.    I have discussed the diagnosis/risks/treatment options with the patient and family and believe the pt to be eligible for discharge home to follow-up with PCP. We also discussed returning to the ED immediately if new or worsening sx occur. We discussed the sx which are most concerning (e.g., sudden worsening pain, fever, inability to tolerate by mouth) that necessitate immediate return. Medications administered to the patient during their visit and any new prescriptions provided to the patient are listed below.  Medications given during this visit Medications  sodium chloride 0.9 % bolus 1,000 mL (0 mLs Intravenous Stopped 07/12/16 1152)  morphine 4 MG/ML injection 4 mg (4 mg Intravenous Given 07/12/16 1021)  ondansetron (ZOFRAN) injection 4 mg (4 mg Intravenous Given 07/12/16 1021)     The patient appears reasonably screen and/or stabilized for discharge and I doubt any other medical condition or other [facility] requiring further screening, evaluation, or treatment in the ED at this time prior to discharge.   Final Clinical Impressions(s) / ED Diagnoses   Final diagnoses:  RUQ abdominal pain  Nausea vomiting and diarrhea    New Prescriptions Discharge Medication List as of 07/12/2016 11:52 AM    START taking these medications   Details  ondansetron (ZOFRAN ODT) 4 MG disintegrating tablet 4mg  ODT q4 hours prn nausea/vomit, Print         Melene Planan Jeovani Weisenburger, DO 07/12/16 1445

## 2016-07-12 NOTE — Discharge Instructions (Signed)
°  Also take tylenol 1000mg (2 extra strength) four times a day.   Try the BRAT diet, bannanas rice applesauce and toast.  Then progress to soups and then regular foods.

## 2016-07-12 NOTE — ED Notes (Signed)
Patient transported to Ultrasound 

## 2016-07-12 NOTE — ED Notes (Signed)
Family at bedside. Ginger ale provided.

## 2016-07-12 NOTE — ED Triage Notes (Signed)
Pt reports just recently finishing a round of tamiflu.

## 2016-11-09 ENCOUNTER — Other Ambulatory Visit: Payer: Self-pay | Admitting: Gastroenterology

## 2016-11-09 DIAGNOSIS — R110 Nausea: Secondary | ICD-10-CM

## 2016-11-23 ENCOUNTER — Ambulatory Visit (HOSPITAL_COMMUNITY)
Admission: RE | Admit: 2016-11-23 | Discharge: 2016-11-23 | Disposition: A | Discharge status: Home / Self Care | Payer: 59 | Source: Ambulatory Visit | Attending: Gastroenterology | Admitting: Gastroenterology

## 2016-11-23 DIAGNOSIS — K760 Fatty (change of) liver, not elsewhere classified: Secondary | ICD-10-CM | POA: Insufficient documentation

## 2016-11-23 DIAGNOSIS — R110 Nausea: Secondary | ICD-10-CM | POA: Insufficient documentation

## 2016-11-23 DIAGNOSIS — K828 Other specified diseases of gallbladder: Secondary | ICD-10-CM | POA: Diagnosis not present

## 2016-11-23 MED ORDER — TECHNETIUM TC 99M MEBROFENIN IV KIT: 5.0000 | PACK | Freq: Once | INTRAVENOUS | Status: DC | PRN

## 2016-12-16 ENCOUNTER — Other Ambulatory Visit: Payer: Self-pay | Admitting: Surgery

## 2016-12-20 ENCOUNTER — Other Ambulatory Visit: Payer: Self-pay | Admitting: Surgery

## 2016-12-20 DIAGNOSIS — K8689 Other specified diseases of pancreas: Secondary | ICD-10-CM

## 2017-01-04 ENCOUNTER — Ambulatory Visit
Admission: RE | Admit: 2017-01-04 | Discharge: 2017-01-04 | Disposition: A | Discharge status: Home / Self Care | Payer: 59 | Source: Ambulatory Visit | Attending: Surgery | Admitting: Surgery

## 2017-01-04 DIAGNOSIS — K8689 Other specified diseases of pancreas: Secondary | ICD-10-CM

## 2017-01-04 MED ORDER — GADOBENATE DIMEGLUMINE 529 MG/ML IV SOLN
20.0000 mL | Freq: Once | INTRAVENOUS | Status: AC | PRN
  Administered 2017-01-04: 17 mL via INTRAVENOUS

## 2017-01-05 ENCOUNTER — Other Ambulatory Visit: Payer: Self-pay | Admitting: Surgery

## 2017-01-12 ENCOUNTER — Encounter: Payer: Self-pay | Admitting: Allergy

## 2017-01-12 ENCOUNTER — Ambulatory Visit (INDEPENDENT_AMBULATORY_CARE_PROVIDER_SITE_OTHER): Payer: 59 | Admitting: Allergy

## 2017-01-12 ENCOUNTER — Other Ambulatory Visit: Payer: Self-pay | Admitting: Allergy

## 2017-01-12 VITALS — BP 122/82 | HR 73 | Temp 97.7°F | Resp 14 | Ht 64.0 in | Wt 183.8 lb

## 2017-01-12 DIAGNOSIS — J309 Allergic rhinitis, unspecified: Secondary | ICD-10-CM | POA: Diagnosis not present

## 2017-01-12 DIAGNOSIS — H1010 Acute atopic conjunctivitis, unspecified eye: Secondary | ICD-10-CM

## 2017-01-12 DIAGNOSIS — J4520 Mild intermittent asthma, uncomplicated: Secondary | ICD-10-CM | POA: Diagnosis not present

## 2017-01-12 MED ORDER — FLUTICASONE PROPIONATE 50 MCG/ACT NA SUSP: 2.0000 | Freq: Every day | NASAL | 5 refills | Status: DC

## 2017-01-12 MED ORDER — OLOPATADINE HCL 0.1 % OP SOLN: 1.0000 [drp] | Freq: Two times a day (BID) | OPHTHALMIC | 5 refills | Status: DC

## 2017-01-12 MED ORDER — AZELASTINE HCL 0.1 % NA SOLN: 2.0000 | Freq: Two times a day (BID) | NASAL | 5 refills | Status: AC

## 2017-01-12 NOTE — Pre-Procedure Instructions (Signed)
Sharon FinnerDebra W Barry  01/12/2017      [facility] [place] [facility] [place], [place] - [address] [place] [address] Phone: [phone] Fax: [phone]  [facility] [address] [place] [facility] [place], [place] - [id number] [place] [address] Phone: [phone] Fax: [phone]    Your procedure is scheduled on Augst 9, 2018.  [facility] at 7:00 A.M.  Call this number if you have problems the morning of surgery:  [phone]   Call [phone] if you have any questions prior to your surgery date Monday-Friday 8am-4pm   Remember:  Do not eat food or drink liquids after midnight.   Take these medicines the morning of surgery with A SIP OF WATER Azelastine (Astelin) nasal spray, Flonase nasal spray, Ondansetron (Zofran) if needed, Albuterol Nebulizer if needed, Albuterol inhaler if needed--bring inhaler with you.  7 days prior to surgery STOP taking any Aspirin, Aleve, Naproxen, Ibuprofen, Motrin, Advil, Goody's, BC's, all herbal medications, fish oil, and all vitamins   Do not wear jewelry, make-up or nail polish.  Do not wear lotions, powders, or perfumes, or deoderant.  Do not shave 48 hours prior to surgery.    Do not bring valuables to the hospital.   [facility] is not responsible for any belongings or valuables.  Contacts, dentures or bridgework may not be worn into surgery.  Leave your suitcase in the car.  After surgery it may be brought to your room.  For patients admitted to the hospital, discharge time will be determined by your treatment team.  Patients discharged the day of surgery will not be allowed to drive home.   Special instructions:   - Preparing For Surgery  Before surgery, you can play an important role. Because skin is not sterile, your skin needs to be as free of germs as possible. You can reduce the number of germs on your skin by washing with CHG (chlorahexidine  gluconate) Soap before surgery.  CHG is an antiseptic cleaner which kills germs and bonds with the skin to continue killing germs even after washing.  Please do not use if you have an allergy to CHG or antibacterial soaps. If your skin becomes reddened/irritated stop using the CHG.  Do not shave (including legs and underarms) for at least 48 hours prior to first CHG shower. It is OK to shave your face.  Please follow these instructions carefully.   1. Shower the NIGHT BEFORE SURGERY and the MORNING OF SURGERY with CHG.   2. If you chose to wash your hair, wash your hair first as usual with your normal shampoo.  3. After you shampoo, rinse your hair and body thoroughly to remove the shampoo.  4. Use CHG as you would any other liquid soap. You can apply CHG directly to the skin and wash gently with a scrungie or a clean washcloth.   5. Apply the CHG Soap to your body ONLY FROM THE NECK DOWN.  Do not use on open wounds or open sores. Avoid contact with your eyes, ears, mouth and genitals (private parts). Wash genitals (private parts) with your normal soap.  6. Wash thoroughly, paying special attention to the area where your surgery will be performed.  7. Thoroughly rinse your body with warm water from the neck down.  8. DO NOT shower/wash with your normal soap after using and rinsing off the CHG Soap.  9. Pat yourself dry with a CLEAN TOWEL.  10. Wear CLEAN PAJAMAS   11. Place CLEAN SHEETS on your bed the night of your first shower and DO NOT SLEEP WITH PETS.    Day of Surgery: Do not apply any deodorants/lotions. Please wear clean clothes to the hospital/surgery center.      Please read over the following fact sheets that you were given. Pain Booklet, Coughing and Deep Breathing and Surgical Site Infection Prevention

## 2017-01-12 NOTE — Patient Instructions (Addendum)
Mild intermittent asthma    - have access to albuterol inhaler 2 puffs every 4-6 hours as needed for cough/wheeze/shortness of breath/chest tightness.  May use 15-20 minutes prior to activity.   Monitor frequency of use.      - current symptoms I do not feel is related to your asthma    - at this time would hold off on any daily controller medications  Asthma control goals:   Full participation in all desired activities (may need albuterol before activity)  Albuterol use two time or less a week on average (not counting use with activity)  Cough interfering with sleep two time or less a month  Oral steroids no more than once a year  No hospitalizations  Allergic rhinoconjunctivitis    - your morning symptoms of watery eyes and nasal drainage may indicate environmental allergy    - will obtain environmental allergy panel    - trial Xyzal 5mg  daily    - trial Dymista 1 spray twice a day.   This has both flonase and Astelin together.  Hold your flonase while using Dymista.    If this is no covered by your insurance will order Astelin separately to use along with your flonase.      - use Pataday 1 drop each eye daily as needed  VCD   - believe you could have issues with vocal cord movement.  You have several reasons to have irritation to you vocal cords including nasal drainage and reflux.      - handout provided today  Reflux    - symptoms may worsen above problems    - recommend use of daily Zantac  Follow-up 3-4 months

## 2017-01-12 NOTE — Progress Notes (Signed)
New Patient Note  RE: Sharon Barry MRN: [id number] DOB: [birth year] Date of Office Visit: 01/12/2017  Referring provider: Bosie Clos, MD Primary care provider: Bosie Clos, MD  Chief Complaint: breathing issues  History of present illness: Sharon Barry is a 61 y.o. female presenting today for consultation for asthma.   She reports she has asthma that was diagnosed in adulthood.   She did go to a pulmonologist initially when she was diagnosed and she reports she was told she didn't need to follow-up anymore.   She states she had flu and bronchitis around Feb/March.   She reports having productive cough that lingered until about June.  She has continued to have some issues with cough.   Lately she has been having more issues with her breathing.  She states her PCP was concerned with how much she is using her albuterol.  She also has a nebulizer as well.  She has been treated recently with prednisone for issues with her breathing.    She feels she like she can't get a good breath in (vs out) which leads to a panic feeling.  She also feels like she hears wheezing when she is having difficulty breathing.  She reports she is hoarse in the morning and does have a lot of PND worse in the morning.  She reports triggers of her breathing difficulty include heat/humidity, smoke, gasoline smell, perfumes, "too many aninmals" in an area.    She has been on symbicort (prescribed by PCP), advair (stopped by pulm about 10 years ago).  She does not feel the symbicort made any differences with her symptoms thus she no longer takes this.   She also states the albuterol doesn't relieve symptoms immediately.   She denies any nighttime awakenings.  Denies any hospitalizations.    She works in [employer] and has to go to [employer] homes and that is when she has noted issues around a lot of animals.  She just got new mattress which she feels has helped.  She does report a lot of eye watering and nasal  congestion/drainage.   She was using flonase which was helpful but she ran out.  She uses hydroxyzine every night moreso for itch control related to eczema.  She sees dermatologist in [place] for her eczema.  She also reports heartburn symptoms but states she knows the foods that trigger her reflux and tries to stay away from those foods.  She use to take Prilosec but stopped after concern for all the side effects associated with it.   No food allergy history or concerns.   Review of systems: Review of Systems  Constitutional: Negative for chills, fever and malaise/fatigue.  HENT: Positive for congestion. Negative for ear discharge, ear pain, nosebleeds, sinus pain, sore throat and tinnitus.   Eyes: Negative for discharge and redness.  Respiratory: Positive for cough and shortness of breath. Negative for wheezing.   Cardiovascular: Negative for chest pain.  Gastrointestinal: Positive for heartburn.  Musculoskeletal: Negative for joint pain.  Skin: Positive for itching and rash.  Neurological: Negative for headaches.    All other systems negative unless noted above in HPI  Past medical history: Past Medical History:  Diagnosis Date  . Allergic rhinitis, cause unspecified    skin test pos 10/18/06  . Angio-edema   . Asthma   . Depression   . Eczema   . Esophageal reflux   . Hyperlipidemia   . OSA (obstructive sleep apnea)   .  Unspecified asthma(493.90)    mild reversable obst small airways  . Urticaria, unspecified    insects, chinese food    Past surgical history: Past Surgical History:  Procedure Laterality Date  . ADENOIDECTOMY    . TONSILLECTOMY  2012  . TUBAL LIGATION    . VESICOVAGINAL FISTULA CLOSURE W/ TAH      Family history:  Family History  Problem Relation Age of Onset  . Lung cancer Father        smoked  . Eczema Father   . Stomach cancer Mother        cause of death  . Allergies Mother   . Lung cancer Brother        smoked  . Asthma Brother   . Lung  cancer Maternal Grandfather        smoked  . Bone cancer Maternal Grandmother   . Allergic rhinitis Neg Hx   . Angioedema Neg Hx   . Immunodeficiency Neg Hx   . Urticaria Neg Hx     Social history: She lives in a home with carpeting with gas and electric heating and central cooling. There are no pets in the home. There is no concern for water damage, mildew or roaches in the home. 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 She does have a smoking history from 1980 to 1994 when she quit. She used to smoke a half pack a day.   Medication List: Allergies as of 01/12/2017      Reactions   Codeine Itching   "funny feeling"   Fluticasone-salmeterol Other (See Comments)   (ADVAIR)-asthma worsened   Statins Other (See Comments)   Elevated LFTS & muscle or joint pains   Sulfonamide Derivatives Itching, Swelling   Hydrocodone-acetaminophen Rash      Medication List       Accurate as of 01/12/17  4:03 PM. Always use your most recent med list.          albuterol 108 (90 Base) MCG/ACT inhaler Commonly known as:  PROVENTIL HFA;VENTOLIN HFA Inhale 1-2 puffs into the lungs every 6 (six) hours as needed for wheezing or shortness of breath.   albuterol (2.5 MG/3ML) 0.083% nebulizer solution Commonly known as:  PROVENTIL Inhale 3 mLs into the lungs every 6 (six) hours as needed. For wheezing/shortness of breath.   hydrOXYzine 25 MG tablet Commonly known as:  ATARAX/VISTARIL Take 25 mg by mouth at bedtime. 1 at bedtime   ondansetron 8 MG disintegrating tablet Commonly known as:  ZOFRAN-ODT Take 8 mg by mouth every 8 (eight) hours as needed. For nausea/vomiting   VIVELLE-DOT 0.025 MG/24HR Generic drug:  estradiol Take 0.25 mg by mouth every 3 (three) days.       Known medication allergies: Allergies  Allergen Reactions  . Codeine Itching    "funny feeling"  . Fluticasone-Salmeterol Other (See Comments)    (ADVAIR)-asthma worsened  . Statins Other (See Comments)    Elevated LFTS & muscle or joint  pains  . Sulfonamide Derivatives Itching and Swelling  . Hydrocodone-Acetaminophen Rash     Physical examination: Blood pressure 122/82, pulse 73, temperature 97.7 F (36.5 C), temperature source Oral, resp. rate 14, height 5\' 4"  (1.626 m), weight 183 lb 12.8 oz (83.4 kg), SpO2 95 %.  General: Alert, interactive, in no acute distress. HEENT: PERRLA,TMs pearly gray, turbinates moderately edematous with clear discharge, post-pharynx non erythematous. Neck: Supple without lymphadenopathy. Lungs: Clear to auscultation without wheezing, rhonchi or rales. {no increased work of breathing. CV: Normal S1, S2  without murmurs. Abdomen: Nondistended, nontender. Skin: She has many scattered diffuse hypopigmented macules across her back. Extremities:  No clubbing, cyanosis or edema. Neuro:   Grossly intact.  Diagnositics/Labs:  Spirometry: FEV1: 2.34L 114%, FVC: 2.72L  107%, ratio consistent with nonobstructive pattern  Allergy testing: deferred due to eczema of back  Assessment and plan:   Mild intermittent asthma    - have access to albuterol inhaler 2 puffs every 4-6 hours as needed for cough/wheeze/shortness of breath/chest tightness.  May use 15-20 minutes prior to activity.   Monitor frequency of use.      - current symptoms I do not feel is related to your asthma    - at this time would hold off on any daily controller medications  Asthma control goals:   Full participation in all desired activities (may need albuterol before activity)  Albuterol use two time or less a week on average (not counting use with activity)  Cough interfering with sleep two time or less a month  Oral steroids no more than once a year  No hospitalizations  Allergic rhinoconjunctivitis    - your morning symptoms of watery eyes and nasal drainage may indicate environmental allergy    - will obtain environmental allergy panel    - trial Xyzal 5mg  daily    - trial Dymista 1 spray twice a day.   This has  both flonase and Astelin together.  Hold your flonase while using Dymista.    If this is no covered by your insurance will order Astelin separately to use along with your flonase.      - use Pataday 1 drop each eye daily as needed  VCD   - believe you could have issues with vocal cord movement.  You have several reasons to have irritation to you vocal cords including nasal drainage and reflux.      - handout provided today  Reflux    - symptoms may worsen above problems    - recommend use of daily Zantac  Follow-up 3-4 months  I appreciate the opportunity to take part in Trinia's care. Please do not hesitate to contact me with questions.  Sincerely,   Margo AyeShaylar Chanay Nugent, MD Allergy/Immunology [facility]

## 2017-01-13 ENCOUNTER — Encounter (HOSPITAL_COMMUNITY)
Admission: RE | Admit: 2017-01-13 | Discharge: 2017-01-13 | Disposition: A | Discharge status: Home / Self Care | Payer: 59 | Source: Ambulatory Visit | Attending: Surgery | Admitting: Surgery

## 2017-01-13 ENCOUNTER — Encounter (HOSPITAL_COMMUNITY): Payer: Self-pay

## 2017-01-13 DIAGNOSIS — K828 Other specified diseases of gallbladder: Secondary | ICD-10-CM | POA: Diagnosis not present

## 2017-01-13 DIAGNOSIS — Z01812 Encounter for preprocedural laboratory examination: Secondary | ICD-10-CM | POA: Diagnosis not present

## 2017-01-13 LAB — BASIC METABOLIC PANEL
Anion gap: 8 (ref 5–15)
BUN: 16 mg/dL (ref 6–20)
CHLORIDE: 105 mmol/L (ref 101–111)
CO2: 25 mmol/L (ref 22–32)
CREATININE: 0.99 mg/dL (ref 0.44–1.00)
Calcium: 9.9 mg/dL (ref 8.9–10.3)
GFR calc Af Amer: >60 mL/min (ref >60)
GFR calc non Af Amer: >60 mL/min (ref >60)
GLUCOSE: 83 mg/dL (ref 65–99)
Potassium: 4.3 mmol/L (ref 3.5–5.1)
Sodium: 138 mmol/L (ref 135–145)

## 2017-01-13 LAB — CBC
HCT: 43.7 % (ref 36.0–46.0)
Hemoglobin: 13.9 g/dL (ref 12.0–15.0)
MCH: 28.9 pg (ref 26.0–34.0)
MCHC: 31.8 g/dL (ref 30.0–36.0)
MCV: 90.9 fL (ref 78.0–100.0)
PLATELETS: 326 10*3/uL (ref 150–400)
RBC: 4.81 MIL/uL (ref 3.87–5.11)
RDW: 13.3 % (ref 11.5–15.5)
WBC: 7.5 10*3/uL (ref 4.0–10.5)

## 2017-01-15 LAB — ALLERGENS, ZONE 3
Aspergillus Fumigatus IgE: <0.1 kU/L
Bahia Grass IgE: <0.1 kU/L
Cat Dander IgE: <0.1 kU/L
Cedar, Mountain IgE: <0.1 kU/L
Hickory, White IgE: <0.1 kU/L
Johnson Grass IgE: <0.1 kU/L
Kentucky Bluegrass IgE: <0.1 kU/L
Pigweed, Rough IgE: <0.1 kU/L
Plantain, English IgE: <0.1 kU/L
Ragweed, Short IgE: <0.1 kU/L
Stemphylium Herbarum IgE: <0.1 kU/L
White Mulberry IgE: <0.1 kU/L

## 2017-01-18 NOTE — H&P (Signed)
Sharon Barry  [facility] Patient #: [id number] DOB: May 17, 1956 Married / Language: English / Race: Black or African American Female   History of Present Illness The patient is a 61 year old female who presents with abdominal pain. This patient is referred by Dr. Charna ElizabethJyothi Mann for evaluation of abdominal pain. For almost a year now, the patient's been having epigastric abdominal pain with pain to the right and left upper abdomen. She describes a sharp pain in the left side of the abdomen. She will also have bloating and nausea after fatty meals. She has had no emesis. The pain on the right side is more of a dull ache. She's had no severe weight loss or weight gain. She had an upper endoscopy which was unremarkable. She had an ultrasound which was unremarkable except for having a dilated pancreatic duct in the body of the pancreas. No mass was seen but the duct was dilated up to 5 mm. She also had a HIDA scan showing a 27% gallbladder ejection fraction. There is no history of pancreatic cancer in the family.   Past Surgical History  Appendectomy  Tonsillectomy   Diagnostic Studies History Christianne Dolin(Christen Lambert, RMA;  Colonoscopy  5-10 years ago Mammogram  within last year  Allergies  CODEINE  Hydrocodone w/APAP *ANALGESICS - OPIOID*  Sulfa 10 *OPHTHALMIC AGENTS*  Vicodin *ANALGESICS - OPIOID*   Medication History HydrOXYzine HCl (25MG  Tablet, Oral) Active. Medications Reconciled  Social History  Alcohol use  Occasional alcohol use. Caffeine use  Coffee. No drug use  Tobacco use  Former smoker.  Family History Alcohol Abuse  Father. Arthritis  Daughter. Cancer  Brother, Father, Mother. Cerebrovascular Accident  Mother. Diabetes Mellitus  Mother. Hypertension  Mother. Malignant Neoplasm Of Pancreas  Mother. Migraine Headache  Daughter. Prostate Cancer  Father.  Pregnancy / Birth History Christianne Dolin(Christen [place], [place];  Age at  menarche  10 years. Age of menopause  6846-50 Gravida  7 Length (months) of breastfeeding  3-6 Maternal age  62-20 Para  3  Other Problems Anxiety Disorder  Asthma  Bladder Problems  Depression  Diverticulosis  Gastroesophageal Reflux Disease  Hemorrhoids  Hypercholesterolemia  Oophorectomy  Right. Sleep Apnea     Review of Systems  General Present- Fatigue, Night Sweats and Weight Gain. Not Present- Appetite Loss, Chills, Fever and Weight Loss. Skin Present- Dryness. Not Present- Change in Wart/Mole, Hives, Jaundice, New Lesions, Non-Healing Wounds, Rash and Ulcer. HEENT Present- Hoarseness, Seasonal Allergies and Wears glasses/contact lenses. Not Present- Earache, Hearing Loss, Nose Bleed, Oral Ulcers, Ringing in the Ears, Sinus Pain, Sore Throat, Visual Disturbances and Yellow Eyes. Respiratory Present- Chronic Cough, Difficulty Breathing, Snoring and Wheezing. Not Present- Bloody sputum. Breast Not Present- Breast Mass, Breast Pain, Nipple Discharge and Skin Changes. Cardiovascular Present- Shortness of Breath. Not Present- Chest Pain, Difficulty Breathing Lying Down, Leg Cramps, Palpitations, Rapid Heart Rate and Swelling of Extremities. Gastrointestinal Present- Abdominal Pain, Bloating, Change in Bowel Habits, Constipation, Excessive gas, Gets full quickly at meals, Hemorrhoids, Indigestion and Nausea. Not Present- Bloody Stool, Chronic diarrhea, Difficulty Swallowing, Rectal Pain and Vomiting. Female Genitourinary Not Present- Frequency, Nocturia, Painful Urination, Pelvic Pain and Urgency. Musculoskeletal Not Present- Back Pain, Joint Pain, Joint Stiffness, Muscle Pain, Muscle Weakness and Swelling of Extremities. Neurological Not Present- Decreased Memory, Fainting, Headaches, Numbness, Seizures, Tingling, Tremor, Trouble walking and Weakness. Psychiatric Present- Anxiety, Change in Sleep Pattern and Depression. Not Present- Bipolar, Fearful and Frequent  crying. Endocrine Present- Hot flashes. Not Present- Cold Intolerance, Excessive  Hunger, Hair Changes, Heat Intolerance and New Diabetes. Hematology Not Present- Blood Thinners, Easy Bruising, Excessive bleeding, Gland problems, HIV and Persistent Infections.  Vitals   Weight: 181.6 lb Height: 65in Body Surface Area: 1.9 m Body Mass Index: 30.22 kg/m  Temp.: 98.55F  Pulse: 74 (Regular)  P.OX: 97% (Room air) BP: 118/82 (Sitting, Left Arm, Standard)       Physical Exam  General Mental Status-Alert. General Appearance-Consistent with stated age. Hydration-Well hydrated. Voice-Normal.  Head and Neck Head-normocephalic, atraumatic with no lesions or palpable masses.  Eye Eyeball - Bilateral-Extraocular movements intact. Sclera/Conjunctiva - Bilateral-No scleral icterus.  Chest and Lung Exam Chest and lung exam reveals -quiet, even and easy respiratory effort with no use of accessory muscles and on auscultation, normal breath sounds, no adventitious sounds and normal vocal resonance. Inspection Chest Wall - Normal. Back - normal.  Cardiovascular Cardiovascular examination reveals -on palpation PMI is normal in location and amplitude, no palpable S3 or S4. Normal cardiac borders., normal heart sounds, regular rate and rhythm with no murmurs, carotid auscultation reveals no bruits and normal pedal pulses bilaterally.  Abdomen Inspection Inspection of the abdomen reveals - No Hernias. Skin - Scar - no surgical scars. Palpation/Percussion Palpation and Percussion of the abdomen reveal - Soft, No Rebound tenderness, No Rigidity (guarding) and No hepatosplenomegaly. Tenderness - Epigastrium and Right Upper Quadrant. Note: There is mild tenderness with guarding in the epigastrium and right upper quadrant. There are no palpable masses. Auscultation Auscultation of the abdomen reveals - Bowel sounds normal.  Neurologic - Did not  examine.  Musculoskeletal - Did not examine.    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 Of concern is the ultrasound showing a dilated 5 mm pancreatic duct. It is difficult to tell whether this may represent a obstructing gallstone. Earlier this year, her lipase and liver function tests were normal. Radiologists reviewed the ultrasound and I recommended an MRI with contrast of her pancreas to rule out an obstructing lesion in the pancreas as the cause of the dilated pancreatic duct. This may be what is also causing her left-sided abdominal pain although her gallbladder issues may be causing this as well. I believe we need to get an MRI of her pancreas prior to cholecystectomy to make sure we are not missing a pancreatic abnormality or small malignancy. I discussed this with the patient and her daughter. 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  We will proceed with a laparoscopic cholecystectomy. We discussed the risks in detail.

## 2017-01-19 ENCOUNTER — Encounter (HOSPITAL_COMMUNITY)
Admission: RE | Disposition: A | Discharge status: Home / Self Care | Payer: Self-pay | Source: Ambulatory Visit | Attending: Surgery

## 2017-01-19 ENCOUNTER — Observation Stay (HOSPITAL_COMMUNITY)
Admission: RE | Admit: 2017-01-19 | Discharge: 2017-01-20 | Disposition: A | Discharge status: Home / Self Care | Payer: 59 | Source: Ambulatory Visit | Attending: Surgery | Admitting: Surgery

## 2017-01-19 ENCOUNTER — Ambulatory Visit (HOSPITAL_COMMUNITY): Payer: 59 | Admitting: Anesthesiology

## 2017-01-19 ENCOUNTER — Encounter (HOSPITAL_COMMUNITY): Payer: Self-pay | Admitting: Certified Registered Nurse Anesthetist

## 2017-01-19 DIAGNOSIS — E7800 Pure hypercholesterolemia, unspecified: Secondary | ICD-10-CM | POA: Diagnosis not present

## 2017-01-19 DIAGNOSIS — Z885 Allergy status to narcotic agent status: Secondary | ICD-10-CM | POA: Insufficient documentation

## 2017-01-19 DIAGNOSIS — Z87891 Personal history of nicotine dependence: Secondary | ICD-10-CM | POA: Insufficient documentation

## 2017-01-19 DIAGNOSIS — E669 Obesity, unspecified: Secondary | ICD-10-CM | POA: Diagnosis not present

## 2017-01-19 DIAGNOSIS — F329 Major depressive disorder, single episode, unspecified: Secondary | ICD-10-CM | POA: Insufficient documentation

## 2017-01-19 DIAGNOSIS — K811 Chronic cholecystitis: Secondary | ICD-10-CM | POA: Diagnosis not present

## 2017-01-19 DIAGNOSIS — K219 Gastro-esophageal reflux disease without esophagitis: Secondary | ICD-10-CM | POA: Diagnosis not present

## 2017-01-19 DIAGNOSIS — K824 Cholesterolosis of gallbladder: Secondary | ICD-10-CM | POA: Diagnosis not present

## 2017-01-19 DIAGNOSIS — Z6830 Body mass index (BMI) 30.0-30.9, adult: Secondary | ICD-10-CM | POA: Insufficient documentation

## 2017-01-19 DIAGNOSIS — F419 Anxiety disorder, unspecified: Secondary | ICD-10-CM | POA: Diagnosis not present

## 2017-01-19 DIAGNOSIS — Z79899 Other long term (current) drug therapy: Secondary | ICD-10-CM | POA: Insufficient documentation

## 2017-01-19 DIAGNOSIS — Z7951 Long term (current) use of inhaled steroids: Secondary | ICD-10-CM | POA: Insufficient documentation

## 2017-01-19 DIAGNOSIS — J45909 Unspecified asthma, uncomplicated: Secondary | ICD-10-CM | POA: Diagnosis not present

## 2017-01-19 DIAGNOSIS — K828 Other specified diseases of gallbladder: Secondary | ICD-10-CM | POA: Diagnosis present

## 2017-01-19 DIAGNOSIS — G4730 Sleep apnea, unspecified: Secondary | ICD-10-CM | POA: Insufficient documentation

## 2017-01-19 HISTORY — PX: LAPAROSCOPIC CHOLECYSTECTOMY: SUR755

## 2017-01-19 HISTORY — PX: CHOLECYSTECTOMY: SHX55

## 2017-01-19 SURGERY — LAPAROSCOPIC CHOLECYSTECTOMY
Anesthesia: General | Site: Abdomen

## 2017-01-19 MED ORDER — LACTATED RINGERS IV SOLN: INTRAVENOUS | Status: DC

## 2017-01-19 MED ORDER — CHLORHEXIDINE GLUCONATE CLOTH 2 % EX PADS: 6.0000 | MEDICATED_PAD | Freq: Once | CUTANEOUS | Status: DC

## 2017-01-19 MED ORDER — DIPHENHYDRAMINE HCL 12.5 MG/5ML PO ELIX
12.5000 mg | ORAL_SOLUTION | Freq: Four times a day (QID) | ORAL | Status: DC | PRN
  Administered 2017-01-20: 12.5 mg via ORAL
  Filled 2017-01-19: qty 10

## 2017-01-19 MED ORDER — HYDROXYZINE HCL 25 MG PO TABS
25.0000 mg | ORAL_TABLET | Freq: Every day | ORAL | Status: DC
  Administered 2017-01-19: 25 mg via ORAL
  Filled 2017-01-19: qty 1

## 2017-01-19 MED ORDER — FENTANYL CITRATE (PF) 100 MCG/2ML IJ SOLN
25.0000 ug | INTRAMUSCULAR | Status: DC | PRN
  Administered 2017-01-19 (×4): 25 ug via INTRAVENOUS

## 2017-01-19 MED ORDER — LACTATED RINGERS IV SOLN
INTRAVENOUS | Status: DC
  Administered 2017-01-19 (×3): via INTRAVENOUS

## 2017-01-19 MED ORDER — BUPIVACAINE-EPINEPHRINE (PF) 0.25% -1:200000 IJ SOLN
INTRAMUSCULAR | Status: AC
  Filled 2017-01-19: qty 30

## 2017-01-19 MED ORDER — SUGAMMADEX SODIUM 200 MG/2ML IV SOLN
INTRAVENOUS | Status: DC | PRN
  Administered 2017-01-19: 200 mg via INTRAVENOUS

## 2017-01-19 MED ORDER — DIPHENHYDRAMINE HCL 50 MG/ML IJ SOLN: 12.5000 mg | Freq: Four times a day (QID) | INTRAMUSCULAR | Status: DC | PRN

## 2017-01-19 MED ORDER — LIDOCAINE HCL (CARDIAC) 20 MG/ML IV SOLN
INTRAVENOUS | Status: DC | PRN
  Administered 2017-01-19: 80 mg via INTRAVENOUS

## 2017-01-19 MED ORDER — ALBUTEROL SULFATE (2.5 MG/3ML) 0.083% IN NEBU: 3.0000 mL | INHALATION_SOLUTION | Freq: Four times a day (QID) | RESPIRATORY_TRACT | Status: DC | PRN

## 2017-01-19 MED ORDER — 0.9 % SODIUM CHLORIDE (POUR BTL) OPTIME
TOPICAL | Status: DC | PRN
  Administered 2017-01-19: 1000 mL

## 2017-01-19 MED ORDER — PROMETHAZINE HCL 25 MG/ML IJ SOLN: 6.2500 mg | INTRAMUSCULAR | Status: DC | PRN

## 2017-01-19 MED ORDER — MIDAZOLAM HCL 5 MG/5ML IJ SOLN
INTRAMUSCULAR | Status: DC | PRN
  Administered 2017-01-19: 2 mg via INTRAVENOUS

## 2017-01-19 MED ORDER — PROPOFOL 10 MG/ML IV BOLUS
INTRAVENOUS | Status: AC
  Filled 2017-01-19: qty 20

## 2017-01-19 MED ORDER — EPHEDRINE SULFATE 50 MG/ML IJ SOLN
INTRAMUSCULAR | Status: DC | PRN
  Administered 2017-01-19: 5 mg via INTRAVENOUS

## 2017-01-19 MED ORDER — FENTANYL CITRATE (PF) 100 MCG/2ML IJ SOLN
INTRAMUSCULAR | Status: AC
  Filled 2017-01-19: qty 2

## 2017-01-19 MED ORDER — ENOXAPARIN SODIUM 40 MG/0.4ML ~~LOC~~ SOLN
40.0000 mg | SUBCUTANEOUS | Status: DC
  Administered 2017-01-20: 40 mg via SUBCUTANEOUS
  Filled 2017-01-19: qty 0.4

## 2017-01-19 MED ORDER — FENTANYL CITRATE (PF) 250 MCG/5ML IJ SOLN
INTRAMUSCULAR | Status: AC
  Filled 2017-01-19: qty 5

## 2017-01-19 MED ORDER — CEFAZOLIN SODIUM-DEXTROSE 2-4 GM/100ML-% IV SOLN
2.0000 g | INTRAVENOUS | Status: AC
  Administered 2017-01-19: 2 g via INTRAVENOUS

## 2017-01-19 MED ORDER — SODIUM CHLORIDE 0.9 % IV SOLN
INTRAVENOUS | Status: DC
  Administered 2017-01-19: 12:00:00 via INTRAVENOUS

## 2017-01-19 MED ORDER — ONDANSETRON 4 MG PO TBDP
4.0000 mg | ORAL_TABLET | Freq: Four times a day (QID) | ORAL | Status: DC | PRN
  Administered 2017-01-19: 4 mg via ORAL
  Filled 2017-01-19: qty 1

## 2017-01-19 MED ORDER — FENTANYL CITRATE (PF) 100 MCG/2ML IJ SOLN
INTRAMUSCULAR | Status: DC | PRN
  Administered 2017-01-19: 50 ug via INTRAVENOUS
  Administered 2017-01-19: 100 ug via INTRAVENOUS
  Administered 2017-01-19 (×2): 50 ug via INTRAVENOUS

## 2017-01-19 MED ORDER — MIDAZOLAM HCL 2 MG/2ML IJ SOLN
INTRAMUSCULAR | Status: AC
  Filled 2017-01-19: qty 2

## 2017-01-19 MED ORDER — OLOPATADINE HCL 0.1 % OP SOLN: 1.0000 [drp] | Freq: Two times a day (BID) | OPHTHALMIC | Status: DC

## 2017-01-19 MED ORDER — OXYCODONE HCL 5 MG PO TABS
5.0000 mg | ORAL_TABLET | ORAL | Status: DC | PRN
  Administered 2017-01-19 – 2017-01-20 (×4): 10 mg via ORAL
  Filled 2017-01-19 (×4): qty 2

## 2017-01-19 MED ORDER — BUPIVACAINE-EPINEPHRINE 0.25% -1:200000 IJ SOLN
INTRAMUSCULAR | Status: DC | PRN
  Administered 2017-01-19: 20 mL

## 2017-01-19 MED ORDER — ONDANSETRON HCL 4 MG/2ML IJ SOLN
INTRAMUSCULAR | Status: DC | PRN
  Administered 2017-01-19: 4 mg via INTRAVENOUS

## 2017-01-19 MED ORDER — ONDANSETRON HCL 4 MG/2ML IJ SOLN
4.0000 mg | Freq: Four times a day (QID) | INTRAMUSCULAR | Status: DC | PRN
  Administered 2017-01-19: 4 mg via INTRAVENOUS
  Filled 2017-01-19: qty 2

## 2017-01-19 MED ORDER — ROCURONIUM BROMIDE 100 MG/10ML IV SOLN
INTRAVENOUS | Status: DC | PRN
  Administered 2017-01-19: 20 mg via INTRAVENOUS

## 2017-01-19 MED ORDER — HYDROMORPHONE HCL 1 MG/ML IJ SOLN
1.0000 mg | INTRAMUSCULAR | Status: DC | PRN
  Administered 2017-01-19: 1 mg via INTRAVENOUS
  Filled 2017-01-19: qty 1

## 2017-01-19 MED ORDER — SODIUM CHLORIDE 0.9 % IR SOLN
Status: DC | PRN
  Administered 2017-01-19: 1000 mL

## 2017-01-19 MED ORDER — CEFAZOLIN SODIUM-DEXTROSE 2-4 GM/100ML-% IV SOLN
INTRAVENOUS | Status: AC
  Filled 2017-01-19: qty 100

## 2017-01-19 SURGICAL SUPPLY — 30 items
APPLIER CLIP 5 13 M/L LIGAMAX5 (MISCELLANEOUS) ×2
CANISTER SUCT 3000ML PPV (MISCELLANEOUS) ×2 IMPLANT
CHLORAPREP W/TINT 26ML (MISCELLANEOUS) ×2 IMPLANT
CLIP APPLIE 5 13 M/L LIGAMAX5 (MISCELLANEOUS) ×1 IMPLANT
COVER SURGICAL LIGHT HANDLE (MISCELLANEOUS) ×2 IMPLANT
DERMABOND ADVANCED (GAUZE/BANDAGES/DRESSINGS) ×1
DERMABOND ADVANCED .7 DNX12 (GAUZE/BANDAGES/DRESSINGS) ×1 IMPLANT
ELECT REM PT RETURN 9FT ADLT (ELECTROSURGICAL) ×2
ELECTRODE REM PT RTRN 9FT ADLT (ELECTROSURGICAL) ×1 IMPLANT
GLOVE SURG SIGNA 7.5 PF LTX (GLOVE) ×2 IMPLANT
GOWN STRL REUS W/ TWL LRG LVL3 (GOWN DISPOSABLE) ×3 IMPLANT
GOWN STRL REUS W/ TWL XL LVL3 (GOWN DISPOSABLE) ×1 IMPLANT
GOWN STRL REUS W/TWL LRG LVL3 (GOWN DISPOSABLE) ×3
GOWN STRL REUS W/TWL XL LVL3 (GOWN DISPOSABLE) ×1
KIT BASIN OR (CUSTOM PROCEDURE TRAY) ×2 IMPLANT
KIT ROOM TURNOVER OR (KITS) ×2 IMPLANT
NS IRRIG 1000ML POUR BTL (IV SOLUTION) ×2 IMPLANT
PAD ARMBOARD 7.5X6 YLW CONV (MISCELLANEOUS) ×2 IMPLANT
POUCH SPECIMEN RETRIEVAL 10MM (ENDOMECHANICALS) ×2 IMPLANT
SCISSORS LAP 5X35 DISP (ENDOMECHANICALS) ×2 IMPLANT
SET IRRIG TUBING LAPAROSCOPIC (IRRIGATION / IRRIGATOR) ×2 IMPLANT
SLEEVE ENDOPATH XCEL 5M (ENDOMECHANICALS) ×4 IMPLANT
SPECIMEN JAR SMALL (MISCELLANEOUS) ×2 IMPLANT
SUT MNCRL AB 4-0 PS2 18 (SUTURE) ×2 IMPLANT
TOWEL OR 17X24 6PK STRL BLUE (TOWEL DISPOSABLE) ×2 IMPLANT
TOWEL OR 17X26 10 PK STRL BLUE (TOWEL DISPOSABLE) IMPLANT
TRAY LAPAROSCOPIC MC (CUSTOM PROCEDURE TRAY) ×2 IMPLANT
TROCAR XCEL BLUNT TIP 100MML (ENDOMECHANICALS) ×2 IMPLANT
TROCAR XCEL NON-BLD 5MMX100MML (ENDOMECHANICALS) ×2 IMPLANT
TUBING INSUFFLATION (TUBING) ×2 IMPLANT

## 2017-01-19 NOTE — Op Note (Signed)

## 2017-01-19 NOTE — Anesthesia Preprocedure Evaluation (Signed)
Anesthesia Evaluation  Patient identified by MRN, date of birth, ID band Patient awake    Reviewed: Allergy & Precautions, NPO status , Patient's Chart, lab work & pertinent test results  Airway Mallampati: II  TM Distance: >3 FB Neck ROM: Full    Dental no notable dental hx. (+) Dental Advisory Given   Pulmonary asthma , sleep apnea , former smoker,    Pulmonary exam normal breath sounds clear to auscultation       Cardiovascular negative cardio ROS Normal cardiovascular exam Rhythm:Regular Rate:Normal     Neuro/Psych PSYCHIATRIC DISORDERS Depression negative neurological ROS     GI/Hepatic Neg liver ROS, GERD  ,  Endo/Other  Obesity  Renal/GU negative Renal ROS  negative genitourinary   Musculoskeletal negative musculoskeletal ROS (+)   Abdominal   Peds negative pediatric ROS (+)  Hematology negative hematology ROS (+)   Anesthesia Other Findings   Reproductive/Obstetrics negative OB ROS                            Anesthesia Physical Anesthesia Plan  ASA: II  Anesthesia Plan: General   Post-op Pain Management:    Induction: Intravenous  PONV Risk Score and Plan: 3 and Ondansetron, Dexamethasone, Midazolam, Treatment may vary due to age or medical condition and Scopolamine patch - Pre-op  Airway Management Planned: Oral ETT  Additional Equipment:   Intra-op Plan:   Post-operative Plan: Extubation in OR  Informed Consent: I have reviewed the patients History and Physical, chart, labs and discussed the procedure including the risks, benefits and alternatives for the proposed anesthesia with the patient or authorized representative who has indicated his/her understanding and acceptance.   Dental advisory given  Plan Discussed with: CRNA  Anesthesia Plan Comments:        Anesthesia Quick Evaluation

## 2017-01-19 NOTE — Anesthesia Procedure Notes (Signed)
Procedure Name: Intubation Date/Time: 01/19/2017 9:09 AM Performed by: Shirlyn Goltz Pre-anesthesia Checklist: Patient identified, Emergency Drugs available, Suction available and Patient being monitored Patient Re-evaluated:Patient Re-evaluated prior to induction Oxygen Delivery Method: Circle system utilized Preoxygenation: Pre-oxygenation with 100% oxygen Induction Type: IV induction Ventilation: Mask ventilation without difficulty Laryngoscope Size: Mac and 3 Grade View: Grade I Tube type: Oral Tube size: 7.0 mm Number of attempts: 1 Airway Equipment and Method: Stylet Placement Confirmation: ETT inserted through vocal cords under direct vision,  positive ETCO2 and breath sounds checked- equal and bilateral Secured at: 21 cm Tube secured with: Tape Dental Injury: Teeth and Oropharynx as per pre-operative assessment

## 2017-01-19 NOTE — Transfer of Care (Signed)
Immediate Anesthesia Transfer of Care Note  Patient: Sharon Barry  Procedure(s) Performed: Procedure(s): LAPAROSCOPIC CHOLECYSTECTOMY (N/A)  Patient Location: PACU  Anesthesia Type:General  Level of Consciousness: awake, alert , oriented and patient cooperative  Airway & Oxygen Therapy: Patient Spontanous Breathing and Patient connected to nasal cannula oxygen  Post-op Assessment: Report given to RN and Post -op Vital signs reviewed and stable  Post vital signs: Reviewed and stable  Last Vitals:  Vitals:   01/19/17 0722 01/19/17 0723  BP:  (!) 149/73  Pulse: 67   Resp: 18   Temp: 36.8 C   SpO2: 100%     Last Pain:  Vitals:   01/19/17 0722  TempSrc: Oral         Complications: No apparent anesthesia complications

## 2017-01-19 NOTE — Interval H&P Note (Signed)
History and Physical Interval Note: no change in H and P.  I discussed the procedure in detail.    We discussed the risks and benefits of a laparoscopic cholecystectomy and possible cholangiogram including, but not limited to bleeding, infection, injury to surrounding structures such as the intestine or liver, bile leak, retained gallstones, need to convert to an open procedure, prolonged diarrhea, blood clots such as  DVT, common bile duct injury, anesthesia risks, and possible need for additional procedures.  We discussed the possibility that the pancreatic divism could still also cause pain not improved with lap chole. The likelihood of improvement in symptoms and return to the patient's normal status is good. We discussed the typical post-operative recovery course.  01/19/2017 8:25 AM  Sharon Barry  has presented today for surgery, with the diagnosis of biliary dyskinesia  The various methods of treatment have been discussed with the patient and family. After consideration of risks, benefits and other options for treatment, the patient has consented to  Procedure(s): LAPAROSCOPIC CHOLECYSTECTOMY (N/A) as a surgical intervention .  The patient's history has been reviewed, patient examined, no change in status, stable for surgery.  I have reviewed the patient's chart and labs.  Questions were answered to the patient's satisfaction.     [name] A

## 2017-01-19 NOTE — Progress Notes (Signed)
RT called by RN to place pt on CPAP. RT placed pt on CPAP with a pressure of 4. Pt stated that she was comfortable and educated to call if needed anything.

## 2017-01-19 NOTE — Anesthesia Postprocedure Evaluation (Signed)
Anesthesia Post Note  Patient: Margot AblesDebra W Niehaus  Procedure(s) Performed: Procedure(s) (LRB): LAPAROSCOPIC CHOLECYSTECTOMY (N/A)     Patient location during evaluation: PACU Anesthesia Type: General Level of consciousness: awake and alert Pain management: pain level controlled Vital Signs Assessment: post-procedure vital signs reviewed and stable Respiratory status: spontaneous breathing, nonlabored ventilation and respiratory function stable Cardiovascular status: blood pressure returned to baseline and stable Postop Assessment: no signs of nausea or vomiting Anesthetic complications: no    Last Vitals:  Vitals:   01/19/17 1045 01/19/17 1050  BP:  118/83  Pulse: (!) 59 66  Resp: 14 18  Temp:    SpO2: 95% 94%    Last Pain:  Vitals:   01/19/17 1026  TempSrc:   PainSc: 10-Worst pain ever                 Beryle Lathehomas E Daylin Gruszka

## 2017-01-20 ENCOUNTER — Encounter (HOSPITAL_COMMUNITY): Payer: Self-pay | Admitting: Surgery

## 2017-01-20 DIAGNOSIS — K811 Chronic cholecystitis: Secondary | ICD-10-CM | POA: Diagnosis not present

## 2017-01-20 MED ORDER — OXYCODONE HCL 5 MG PO TABS: 5.0000 mg | ORAL_TABLET | Freq: Four times a day (QID) | ORAL | 0 refills | Status: DC | PRN

## 2017-01-20 NOTE — Progress Notes (Signed)
Patient ID: Sharon AblesDebra W Barry, female   DOB: Feb 18, 1956, 61 y.o.   MRN: [id number]   Doing well post op Tolerating po Ambulating Abdomen soft  Plan: discharge

## 2017-01-20 NOTE — Progress Notes (Signed)
Discussed discharge summary with patient. Reviewed all medications with patient. Patient received Rx. Patient ready for discharge. 

## 2017-01-20 NOTE — Discharge Instructions (Signed)
[facility] ______[facility] °LAPAROSCOPIC SURGERY: POST OP INSTRUCTIONS °Always review your discharge instruction sheet given to you by the facility where your surgery was performed. °IF YOU HAVE DISABILITY OR FAMILY LEAVE FORMS, YOU MUST BRING THEM TO THE OFFICE FOR PROCESSING.   °DO NOT GIVE THEM TO YOUR DOCTOR. ° °1. A prescription for pain medication may be given to you upon discharge.  Take your pain medication as prescribed, if needed.  If narcotic pain medicine is not needed, then you may take acetaminophen (Tylenol) or ibuprofen (Advil) as needed. °2. Take your usually prescribed medications unless otherwise directed. °3. If you need a refill on your pain medication, please contact your pharmacy.  They will contact our office to request authorization. Prescriptions will not be filled after 5pm or on week-ends. °4. You should follow a light diet the first few days after arrival home, such as soup and crackers, etc.  Be sure to include lots of fluids daily. °5. Most patients will experience some swelling and bruising in the area of the incisions.  Ice packs will help.  Swelling and bruising can take several days to resolve.  °6. It is common to experience some constipation if taking pain medication after surgery.  Increasing fluid intake and taking a stool softener (such as Colace) will usually help or prevent this problem from occurring.  A mild laxative (Milk of Magnesia or Miralax) should be taken according to package instructions if there are no bowel movements after 48 hours. °7. Unless discharge instructions indicate otherwise, you may remove your bandages 24-48 hours after surgery, and you may shower at that time.  You may have steri-strips (small skin tapes) in place directly over the incision.  These strips should be left on the skin for 7-10 days.  If your surgeon used skin glue on the incision, you may shower in 24 hours.  The glue will flake off over the next 2-3 weeks.  Any sutures or  staples will be removed at the office during your follow-up visit. °8. ACTIVITIES:  You may resume regular (light) daily activities beginning the next day--such as daily self-care, walking, climbing stairs--gradually increasing activities as tolerated.  You may have sexual intercourse when it is comfortable.  Refrain from any heavy lifting or straining until approved by your doctor. °a. You may drive when you are no longer taking prescription pain medication, you can comfortably wear a seatbelt, and you can safely maneuver your car and apply brakes. °b. RETURN TO WORK:  __________________________________________________________ °9. You should see your doctor in the office for a follow-up appointment approximately 2-3 weeks after your surgery.  Make sure that you call for this appointment within a day or two after you arrive home to insure a convenient appointment time. °10. OTHER INSTRUCTIONS: __________________________________________________________________________________________________________________________ __________________________________________________________________________________________________________________________ °WHEN TO CALL YOUR DOCTOR: °1. Fever over 101.0 °2. Inability to urinate °3. Continued bleeding from incision. °4. Increased pain, redness, or drainage from the incision. °5. Increasing abdominal pain ° °The clinic staff is available to answer your questions during regular business hours.  Please don’t hesitate to call and ask to speak to one of the nurses for clinical concerns.  If you have a medical emergency, go to the nearest emergency room or call 911.  A surgeon from [facility] is always on call at the hospital. °[address], [address], [place], [place]  [address] ? [address] [address], [place], [place]   [address] °[phone] ? [phone] ? FAX [phone] °Web site:   [email or web address] °

## 2017-01-20 NOTE — Discharge Summary (Signed)
Physician Discharge Summary  Patient ID: Sharon Barry MRN: [id number] DOB/AGE: January 24, 1956 61 y.o.  Admit date: 01/19/2017 Discharge date: 01/20/2017  Admission Diagnoses:  Discharge Diagnoses:  Active Problems:   Chronic cholecystitis sleep apnea  Discharged Condition: good  Hospital Course: uneventful post op recovery.  Discharged home POD#1  Consults: None  Significant Diagnostic Studies:   Treatments: surgery: lap chole  Discharge Exam: Blood pressure 113/69, pulse 63, temperature 98 F (36.7 C), resp. rate 16, height 5\' 5"  (1.651 m), weight 82.6 kg (182 lb), SpO2 94 %. General appearance: alert, cooperative and no distress Resp: clear to auscultation bilaterally Cardio: regular rate and rhythm, S1, S2 normal, no murmur, click, rub or gallop Incision/Wound:incisions clean, abd soft  Disposition: 01-Home or Self Care  Discharge Instructions    Discharge patient    Complete by:  As directed    Discharge disposition:  01-Home or Self Care   Discharge patient date:  01/20/2017     Allergies as of 01/20/2017      Reactions   Codeine Itching   "funny feeling"   Fluticasone-salmeterol Other (See Comments)   (ADVAIR)-asthma worsened   Statins Other (See Comments)   Elevated LFTS & muscle or joint pains   Sulfonamide Derivatives Itching, Swelling   Hydrocodone-acetaminophen Rash      Medication List    TAKE these medications   albuterol 108 (90 Base) MCG/ACT inhaler Commonly known as:  PROVENTIL HFA;VENTOLIN HFA Inhale 1-2 puffs into the lungs every 6 (six) hours as needed for wheezing or shortness of breath.   albuterol (2.5 MG/3ML) 0.083% nebulizer solution Commonly known as:  PROVENTIL Inhale 3 mLs into the lungs every 6 (six) hours as needed. For wheezing/shortness of breath.   azelastine 0.1 % nasal spray Commonly known as:  ASTELIN Place 2 sprays into both nostrils 2 (two) times daily. Use in each nostril as directed   fluticasone 50 MCG/ACT nasal  spray Commonly known as:  FLONASE Place 2 sprays into both nostrils daily.   hydrOXYzine 25 MG tablet Commonly known as:  ATARAX/VISTARIL Take 25 mg by mouth at bedtime. 1 at bedtime   levocetirizine 5 MG tablet Commonly known as:  XYZAL Take 5 mg by mouth daily.   olopatadine 0.1 % ophthalmic solution Commonly known as:  PATANOL Place 1 drop into both eyes 2 (two) times daily.   ondansetron 8 MG disintegrating tablet Commonly known as:  ZOFRAN-ODT Take 8 mg by mouth every 8 (eight) hours as needed. For nausea/vomiting   oxyCODONE 5 MG immediate release tablet Commonly known as:  Oxy IR/ROXICODONE Take 1-2 tablets (5-10 mg total) by mouth every 6 (six) hours as needed for moderate pain.   VIVELLE-DOT 0.025 MG/24HR Generic drug:  estradiol Take 0.25 mg by mouth every 3 (three) days.      Follow-up Information    Abigail MiyamotoBlackman, Zahara Rembert, MD. Schedule an appointment as soon as possible for a visit in 3 week(s).   Specialty:  General Surgery Contact information: [address] [place] [address] [phone]           Signed: Shelly RubensteinBLACKMAN,Jereline Ticer A 01/20/2017, 9:27 AM

## 2017-07-04 ENCOUNTER — Other Ambulatory Visit: Payer: Self-pay | Admitting: Obstetrics & Gynecology

## 2017-07-04 DIAGNOSIS — Z1231 Encounter for screening mammogram for malignant neoplasm of breast: Secondary | ICD-10-CM

## 2017-07-11 ENCOUNTER — Ambulatory Visit
Admission: RE | Admit: 2017-07-11 | Discharge: 2017-07-11 | Disposition: A | Discharge status: Home / Self Care | Payer: 59 | Source: Ambulatory Visit | Attending: Obstetrics & Gynecology | Admitting: Obstetrics & Gynecology

## 2017-07-11 DIAGNOSIS — Z1231 Encounter for screening mammogram for malignant neoplasm of breast: Secondary | ICD-10-CM

## 2017-07-26 ENCOUNTER — Ambulatory Visit: Payer: Self-pay

## 2017-12-04 ENCOUNTER — Other Ambulatory Visit: Payer: Self-pay

## 2017-12-04 ENCOUNTER — Encounter (HOSPITAL_BASED_OUTPATIENT_CLINIC_OR_DEPARTMENT_OTHER): Payer: Self-pay

## 2017-12-04 ENCOUNTER — Emergency Department (HOSPITAL_BASED_OUTPATIENT_CLINIC_OR_DEPARTMENT_OTHER)
Admission: EM | Admit: 2017-12-04 | Discharge: 2017-12-05 | Disposition: A | Discharge status: Home / Self Care | Payer: 59 | Attending: Emergency Medicine | Admitting: Emergency Medicine

## 2017-12-04 DIAGNOSIS — R072 Precordial pain: Secondary | ICD-10-CM

## 2017-12-04 DIAGNOSIS — E860 Dehydration: Secondary | ICD-10-CM

## 2017-12-04 DIAGNOSIS — Z79899 Other long term (current) drug therapy: Secondary | ICD-10-CM | POA: Diagnosis not present

## 2017-12-04 DIAGNOSIS — J45909 Unspecified asthma, uncomplicated: Secondary | ICD-10-CM | POA: Diagnosis not present

## 2017-12-04 DIAGNOSIS — Z87891 Personal history of nicotine dependence: Secondary | ICD-10-CM | POA: Diagnosis not present

## 2017-12-04 DIAGNOSIS — R030 Elevated blood-pressure reading, without diagnosis of hypertension: Secondary | ICD-10-CM

## 2017-12-04 LAB — CBC
HEMATOCRIT: 47.2 % — AB (ref 36.0–46.0)
HEMOGLOBIN: 15.8 g/dL — AB (ref 12.0–15.0)
MCH: 30.4 pg (ref 26.0–34.0)
MCHC: 33.5 g/dL (ref 30.0–36.0)
MCV: 90.9 fL (ref 78.0–100.0)
Platelets: 273 10*3/uL (ref 150–400)
RBC: 5.19 MIL/uL — AB (ref 3.87–5.11)
RDW: 13 % (ref 11.5–15.5)
WBC: 10.8 10*3/uL — ABNORMAL HIGH (ref 4.0–10.5)

## 2017-12-04 NOTE — ED Triage Notes (Signed)
Pt c/o "feeling like my heart racing" approx 7pm- c/o light headed all day-pt states she has been fasting today-NAD-to triage in w/c

## 2017-12-05 ENCOUNTER — Emergency Department (HOSPITAL_BASED_OUTPATIENT_CLINIC_OR_DEPARTMENT_OTHER): Payer: 59

## 2017-12-05 LAB — BASIC METABOLIC PANEL
ANION GAP: 8 (ref 5–15)
BUN: 16 mg/dL (ref 6–20)
CALCIUM: 9.7 mg/dL (ref 8.9–10.3)
CO2: 28 mmol/L (ref 22–32)
Chloride: 104 mmol/L (ref 101–111)
Creatinine, Ser: 0.96 mg/dL (ref 0.44–1.00)
GFR calc Af Amer: >60 mL/min (ref >60)
GLUCOSE: 109 mg/dL — AB (ref 65–99)
Potassium: 3.8 mmol/L (ref 3.5–5.1)
Sodium: 140 mmol/L (ref 135–145)

## 2017-12-05 LAB — TSH: TSH: 3.025 u[IU]/mL (ref 0.350–4.500)

## 2017-12-05 LAB — TROPONIN I: Troponin I: <0.03 ng/mL (ref <0.03)

## 2017-12-05 MED ORDER — SODIUM CHLORIDE 0.9 % IV BOLUS
1000.0000 mL | Freq: Once | INTRAVENOUS | Status: AC
  Administered 2017-12-05: 1000 mL via INTRAVENOUS

## 2017-12-05 NOTE — ED Notes (Signed)
Pt is alert and oriented x 3  Skin warm and dry  Pt is c/o mild headache  No other complaints at this time  Pt on cardiac monitor reading NSR at this time  Family at bedside  NAD noted at this time

## 2017-12-05 NOTE — ED Provider Notes (Signed)
[facility] EMERGENCY DEPARTMENT Provider Note   CSN: [id number] Arrival date & time: 12/04/17  2224     History   Chief Complaint Chief Complaint  Patient presents with  . Tachycardia    HPI Sharon Barry is a 62 y.o. female.  HPI Patient is a 62 year old female with a history of asthma, obstructive sleep apnea, of esophageal reflux who presents to the emergency department with complaints of feeling like her heart was racing today at approximately 7 PM.  She reported a dull pain in her anterior chest without radiation.  No associated shortness of breath.  She states she is felt generally "off" through most the day.  She is currently on a liquid fast for the day as she is dealing with the emotional loss of her friend who had sudden death 3 days ago.  Husband reports that the patient sleep is been poor.  Denies abdominal pain.  No nausea vomiting or diarrhea.  She reports mild headache at this time.  No prior history of cardiac disease.  When she felt that this evening she and her husband chose to go to the pharmacy to check her blood pressure and found it was elevated which prompted the visit to the emergency department.  No history of hypertension.  Patient is on medications for blood pressure.  No back pain.  No abdominal pain.  No radiation of discomfort up to her arms   Past Medical History:  Diagnosis Date  . Allergic rhinitis, cause unspecified    skin test pos 10/18/06  . Angio-edema   . Asthma   . Depression   . Eczema   . Esophageal reflux   . Hyperlipidemia   . OSA (obstructive sleep apnea)    uses CPAP   . Unspecified asthma(493.90)    mild reversable obst small airways  . Urticaria, unspecified    insects, chinese food    Patient Active Problem List   Diagnosis Date Noted  . Chronic cholecystitis 01/19/2017  . 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 Snoring 05/14/2015  . Excessive daytime sleepiness 05/14/2015  . Depression  (emotion) 05/14/2015  . Fatigue due to sleep pattern disturbance 05/14/2015  . Cough 07/20/2011  . ALLERGIC RHINITIS 02/15/2007  . ASTHMA 02/15/2007  . GERD 02/15/2007  . URTICARIA 02/15/2007    Past Surgical History:  Procedure Laterality Date  . ABDOMINAL HYSTERECTOMY    . ADENOIDECTOMY    . APPENDECTOMY    . CHOLECYSTECTOMY N/A 01/19/2017   Procedure: LAPAROSCOPIC CHOLECYSTECTOMY;  Surgeon: Abigail Miyamoto, MD;  Location: [facility] OR;  Service: General;  Laterality: N/A;  . LAPAROSCOPIC CHOLECYSTECTOMY  01/19/2017  . TONSILLECTOMY  2012  . TUBAL LIGATION    . VESICOVAGINAL FISTULA CLOSURE W/ TAH       OB History   None      Home Medications    Prior to Admission medications   Medication Sig Start Date End Date Taking? Authorizing Provider  albuterol (PROVENTIL HFA;VENTOLIN HFA) 108 (90 BASE) MCG/ACT inhaler Inhale 1-2 puffs into the lungs every 6 (six) hours as needed for wheezing or shortness of breath.    [provider]  albuterol (PROVENTIL) (2.5 MG/3ML) 0.083% nebulizer solution Inhale 3 mLs into the lungs every 6 (six) hours as needed. For wheezing/shortness of breath. 12/01/16   [provider]  azelastine (ASTELIN) 0.1 % nasal spray Place 2 sprays into both nostrils 2 (two) times daily. Use in each nostril as directed 01/12/17   Margo Aye  Elease HashimotoPatricia, MD  fluticasone [facility](FLONASE) 50 MCG/ACT nasal spray Place 2 sprays into both nostrils daily. 01/12/17   Marcelyn BruinsPadgett, Shaylar Patricia, MD  hydrOXYzine (ATARAX/VISTARIL) 25 MG tablet Take 25 mg by mouth at bedtime. 1 at bedtime 05/24/11   [provider]  levocetirizine (XYZAL) 5 MG tablet Take 5 mg by mouth daily.    [provider]  olopatadine (PATANOL) 0.1 % ophthalmic solution Place 1 drop into both eyes 2 (two) times daily. 01/12/17   Marcelyn BruinsPadgett, Shaylar Patricia, MD  ondansetron (ZOFRAN-ODT) 8 MG disintegrating tablet Take 8 mg by mouth every 8 (eight) hours as needed. For nausea/vomiting 01/03/17    [provider]  oxyCODONE (OXY IR/ROXICODONE) 5 MG immediate release tablet Take 1-2 tablets (5-10 mg total) by mouth every 6 (six) hours as needed for moderate pain. 01/20/17   Abigail MiyamotoBlackman, Douglas, MD  VIVELLE-DOT 0.025 MG/24HR Take 0.25 mg by mouth every 3 (three) days. 12/24/16   [provider]    Family History Family History  Problem Relation Age of Onset  . Lung cancer Father        smoked  . Eczema Father   . Stomach cancer Mother        cause of death  . Allergies Mother   . Lung cancer Brother        smoked  . Asthma Brother   . Lung cancer Maternal Grandfather        smoked  . Bone cancer Maternal Grandmother   . Allergic rhinitis Neg Hx   . Angioedema Neg Hx   . Immunodeficiency Neg Hx   . Urticaria Neg Hx     Social History Social History   Tobacco Use  . Smoking status: Former Smoker    Packs/day: 0.50    Years: 20.00    Pack years: 10.00    Types: Cigarettes    Last attempt to quit: 06/13/1993    Years since quitting: 24.4  . Smokeless tobacco: Never Used  Substance Use Topics  . Alcohol use: Yes    Comment: rarely  . Drug use: No     Allergies   Codeine; Fluticasone-salmeterol; Statins; Sulfonamide derivatives; and Hydrocodone-acetaminophen   Review of Systems Review of Systems  All other systems reviewed and are negative.    Physical Exam Updated Vital Signs BP (!) 146/94   Pulse (!) 59   Temp 98.2 F (36.8 C) (Oral)   Resp 14   Ht 5\' 5"  (1.651 m)   Wt 86.2 kg (190 lb)   SpO2 97%   BMI 31.62 kg/m   Physical Exam  Constitutional: She is oriented to person, place, and time. She appears well-developed and well-nourished.  HENT:  Head: Normocephalic.  Eyes: EOM are normal.  Neck: Normal range of motion.  Cardiovascular: Normal rate and regular rhythm.  Pulmonary/Chest: Effort normal and breath sounds normal. No respiratory distress. She has no wheezes. She has no rales.  Abdominal: Soft. She exhibits no distension.  There is no tenderness.  Musculoskeletal: Normal range of motion. She exhibits no edema or deformity.  Neurological: She is alert and oriented to person, place, and time.  Psychiatric: She has a normal mood and affect.  Nursing note and vitals reviewed.    ED Treatments / Results  Labs (all labs ordered are listed, but only abnormal results are displayed) Labs Reviewed  CBC - Abnormal; Notable for the following components:      Result Value   WBC 10.8 (*)    RBC 5.19 (*)  Hemoglobin 15.8 (*)    HCT 47.2 (*)    All other components within normal limits  BASIC METABOLIC PANEL - Abnormal; Notable for the following components:   Glucose, Bld 109 (*)    All other components within normal limits  TROPONIN I  TSH    EKG EKG Interpretation  Date/Time:  Monday December 04 2017 22:31:45 EDT Ventricular Rate:  68 PR Interval:  154 QRS Duration: 88 QT Interval:  376 QTC Calculation: 399 R Axis:   -9 Text Interpretation:  Normal sinus rhythm Nonspecific T wave abnormality Abnormal ECG No significant change was found Confirmed by Azalia Bilis ([id number]) on 12/04/2017 11:40:23 PM   Radiology Dg Chest 2 View  Result Date: 12/05/2017 CLINICAL DATA:  62 year old female with chest pain and tachycardia. EXAM: CHEST - 2 VIEW COMPARISON:  Chest radiograph dated 12/05/2016 FINDINGS: Minimal bibasilar atelectatic changes. No focal consolidation, pleural effusion, or pneumothorax. The cardiac silhouette is within normal limits. No acute osseous pathology. IMPRESSION: No active cardiopulmonary disease. Electronically Signed   By: Elgie Collard M.D.   On: 12/05/2017 02:19    Procedures Procedures (including critical care time)  Medications Ordered in ED Medications  sodium chloride 0.9 % bolus 1,000 mL (0 mLs Intravenous Stopped 12/05/17 0249)     Initial Impression / Assessment and Plan / ED Course  I have reviewed the triage vital signs and the nursing notes.  Pertinent labs & imaging  results that were available during my care of the patient were reviewed by me and considered in my medical decision making (see chart for details).     EKG normal sinus rhythm.  Patient remained on the monitor while in the emergency department and no ectopy or arrhythmias were noted.  EKG without ischemic changes.  Troponin is negative.  Doubt ACS.  Doubt dissection.  Doubt pulmonary embolism.  Blood pressure improved with IV fluids.  She feels better after IV fluids.  She may become dehydrated today as she is on liquid fast and not eating any food.  She will resume her normal diet tomorrow.  Recommended close primary care follow-up.  She understands return to the ER for new or worsening symptoms.  I do not think she needs additional work-up at this time.  She is currently in the process of grieving regarding the sudden loss of a good friend.  This could be playing a role as well.  This is definitely affecting her sleep.  Recommended that she continue to grieve and discuss her feelings with friends and family and if necessary a therapist  Final Clinical Impressions(s) / ED Diagnoses   Final diagnoses:  Precordial chest pain  Acute dehydration  Elevated blood pressure reading    ED Discharge Orders    None       Azalia Bilis, MD 12/05/17 [phone]

## 2017-12-06 ENCOUNTER — Encounter: Payer: Self-pay | Admitting: Allergy

## 2019-09-05 ENCOUNTER — Other Ambulatory Visit: Payer: Self-pay | Admitting: Obstetrics & Gynecology

## 2019-09-05 DIAGNOSIS — Z1231 Encounter for screening mammogram for malignant neoplasm of breast: Secondary | ICD-10-CM

## 2019-09-18 ENCOUNTER — Other Ambulatory Visit: Payer: Self-pay | Admitting: Family Medicine

## 2019-09-18 DIAGNOSIS — N644 Mastodynia: Secondary | ICD-10-CM

## 2019-09-19 ENCOUNTER — Other Ambulatory Visit: Payer: Self-pay | Admitting: Family Medicine

## 2019-09-19 DIAGNOSIS — N644 Mastodynia: Secondary | ICD-10-CM

## 2019-10-03 ENCOUNTER — Ambulatory Visit
Admission: RE | Admit: 2019-10-03 | Discharge: 2019-10-03 | Disposition: A | Discharge status: Home / Self Care | Payer: 59 | Source: Ambulatory Visit | Attending: Family Medicine | Admitting: Family Medicine

## 2019-10-03 ENCOUNTER — Other Ambulatory Visit: Payer: Self-pay

## 2019-10-03 ENCOUNTER — Ambulatory Visit: Payer: 59

## 2019-10-03 DIAGNOSIS — N644 Mastodynia: Secondary | ICD-10-CM

## 2019-10-30 ENCOUNTER — Other Ambulatory Visit: Payer: Self-pay

## 2019-10-30 ENCOUNTER — Encounter (INDEPENDENT_AMBULATORY_CARE_PROVIDER_SITE_OTHER): Payer: Self-pay | Admitting: Family Medicine

## 2019-10-30 ENCOUNTER — Ambulatory Visit (INDEPENDENT_AMBULATORY_CARE_PROVIDER_SITE_OTHER): Payer: 59 | Admitting: Family Medicine

## 2019-10-30 VITALS — BP 127/83 | HR 69 | Temp 98.4°F | Ht 64.0 in | Wt 195.0 lb

## 2019-10-30 DIAGNOSIS — F3289 Other specified depressive episodes: Secondary | ICD-10-CM | POA: Diagnosis not present

## 2019-10-30 DIAGNOSIS — R5383 Other fatigue: Secondary | ICD-10-CM

## 2019-10-30 DIAGNOSIS — R0602 Shortness of breath: Secondary | ICD-10-CM

## 2019-10-30 DIAGNOSIS — E7849 Other hyperlipidemia: Secondary | ICD-10-CM

## 2019-10-30 DIAGNOSIS — Z9189 Other specified personal risk factors, not elsewhere classified: Secondary | ICD-10-CM | POA: Diagnosis not present

## 2019-10-30 DIAGNOSIS — Z6833 Body mass index (BMI) 33.0-33.9, adult: Secondary | ICD-10-CM

## 2019-10-30 DIAGNOSIS — Z0289 Encounter for other administrative examinations: Secondary | ICD-10-CM

## 2019-10-30 DIAGNOSIS — E559 Vitamin D deficiency, unspecified: Secondary | ICD-10-CM | POA: Diagnosis not present

## 2019-10-30 DIAGNOSIS — R739 Hyperglycemia, unspecified: Secondary | ICD-10-CM

## 2019-10-30 DIAGNOSIS — E669 Obesity, unspecified: Secondary | ICD-10-CM

## 2019-10-30 NOTE — Progress Notes (Signed)
Chief Complaint:   OBESITY Sharon Barry (MR# [id number]) is a 64 y.o. female who presents for evaluation and treatment of obesity and related comorbidities. Current BMI is Body mass index is 33.47 kg/m. Sharon Barry has been struggling with her weight for many years and has been unsuccessful in either losing weight, maintaining weight loss, or reaching her healthy weight goal.  Sharon Barry is currently in the action stage of change and ready to dedicate time achieving and maintaining a healthier weight. Sharon Barry is interested in becoming our patient and working on intensive lifestyle modifications including (but not limited to) diet and exercise for weight loss.  Sharon Barry was referred by fellow church members.  She says she tolerates some dairy, but she is lactose intolerant.  She started gaining weight with remarriage.  She finds herself often skipping breakfast due to timing.  Around 9 am, she has coffee with creamer and sugar (tall [employer]).  At around 11 am, she will have eggs (1 or 2) oatmeal (1 packet) or grits (2 servings) and feels full.  She may have a banana or an orange as well.  About 5 pm or 6 pm, she will have pork chops with gravy, mashed potatoes, and cabbage (feels full).  After dinner, she will have almonds and walnuts.  Sharon Barry's habits were reviewed today and are as follows: Her family eats meals together, she thinks her family will eat healthier with her, her desired weight loss is 60 pounds, she started gaining weight when she remarried in 2000, her heaviest weight ever was 200 pounds, she craves sweets and carbs, she snacks frequently in the evenings, she skips meals frequently, she is frequently drinking liquids with calories, she frequently makes poor food choices, she frequently eats larger portions than normal and she struggles with emotional eating.  Depression Screen Sharon Barry's Food and Mood (modified PHQ-9) score was 6.  Depression screen PHQ 2/9 10/30/2019  Decreased Interest 1    Down, Depressed, Hopeless 1  PHQ - 2 Score 2  Altered sleeping 1  Tired, decreased energy 1  Change in appetite 1  Feeling bad or failure about yourself  0  Trouble concentrating 1  Moving slowly or fidgety/restless 0  Suicidal thoughts 0  PHQ-9 Score 6  Difficult doing work/chores Not difficult at all   Subjective:   1. Other fatigue Sharon Barry denies daytime somnolence and admits to waking up still tired. Patent has a history of symptoms of morning fatigue, morning headache and snoring. Sharon Barry generally gets 6 hours of sleep per night, and states that she has poor quality sleep. Snoring is present. Apneic episodes are present. Epworth Sleepiness Score is 7.  2. SOB (shortness of breath) on exertion Sharon Barry notes increasing shortness of breath with exercising and seems to be worsening over time with weight gain. She notes getting out of breath sooner with activity than she used to. This has gotten worse recently. Sharon Barry denies shortness of breath at rest or orthopnea.  3. Vitamin D deficiency No vitamin D level in Epic.  She is currently taking OTC vitamin D. She denies nausea, vomiting or muscle weakness.  4. Other hyperlipidemia Sharon Barry has hyperlipidemia and has been trying to improve her cholesterol levels with intensive lifestyle modification including a low saturated fat diet, exercise and weight loss. She denies any chest pain, claudication or myalgias.  She is taking red yeast extract.  She is not on a statin.  Lab Results  Component Value Date   ALT 29 07/12/2016  AST 33 07/12/2016   ALKPHOS 80 07/12/2016   BILITOT 0.6 07/12/2016   5. Hyperglycemia Sharon Barry has a history of some elevated blood glucose readings without a diagnosis of diabetes. She denies polyphagia.  6. Other depression, with emotional eating Sharon Barry is struggling with emotional eating and using food for comfort to the extent that it is negatively impacting her health. She has been working on behavior modification  techniques to help reduce her emotional eating and has been unsuccessful. She shows no sign of suicidal or homicidal ideations.  She was previously on doxepin but changed to Pristiq.  She is waiting to hear from her GYN, who prescribed the medications.  7. At risk for osteoporosis Sharon Barry is at higher risk of osteopenia and osteoporosis due to Vitamin D deficiency.   Assessment/Plan:   1. Other fatigue Sharon Barry does feel that her weight is causing her energy to be lower than it should be. Fatigue may be related to obesity, depression or many other causes. Labs will be ordered, and in the meanwhile, Sharon Barry will focus on self care including making healthy food choices, increasing physical activity and focusing on stress reduction. - EKG 12-Lead - CBC with Differential/Platelet - Vitamin B12 - T3 - T4 - TSH - Folate  2. SOB (shortness of breath) on exertion Sharon Barry does feel that she gets out of breath more easily that she used to when she exercises. Sharon Barry's shortness of breath appears to be obesity related and exercise induced. She has agreed to work on weight loss and gradually increase exercise to treat her exercise induced shortness of breath. Will continue to monitor closely. - CBC with Differential/Platelet - Vitamin B12 - T3 - T4 - TSH - Folate  3. Vitamin D deficiency Low Vitamin D level contributes to fatigue and are associated with obesity, breast, and colon cancer. She agrees to continue to take OTC vitamin D daily and will follow-up for routine testing of Vitamin D, at least 2-3 times per year to avoid over-replacement. - VITAMIN D 25 Hydroxy (Vit-D Deficiency, Fractures)  4. Other hyperlipidemia Cardiovascular risk and specific lipid/LDL goals reviewed.  We discussed several lifestyle modifications today and Sharon Barry will continue to work on diet, exercise and weight loss efforts. Orders and follow up as documented in patient record.   Counseling Intensive lifestyle modifications are  the first line treatment for this issue. . Dietary changes: Increase soluble fiber. Decrease simple carbohydrates. . Exercise changes: Moderate to vigorous-intensity aerobic activity 150 minutes per week if tolerated. . Lipid-lowering medications: see documented in medical record. - Comprehensive metabolic panel - Lipid Panel With LDL/HDL Ratio  5. Hyperglycemia Fasting labs will be obtained and results with be discussed with Sharon Barry in 2 weeks at her follow up visit. In the meanwhile Sharon Barry was started on a lower simple carbohydrate diet and will work on weight loss efforts. - CBC with Differential/Platelet - Hemoglobin A1c - Insulin, random  6. Other depression, with emotional eating Behavior modification techniques were discussed today to help Sharon Barry deal with her emotional/non-hunger eating behaviors.  Orders and follow up as documented in patient record.   7. At risk for osteoporosis Sharon Barry was given approximately 15 minutes of osteoporosis prevention counseling today. Sharon Barry is at risk for osteopenia and osteoporosis due to her Vitamin D deficiency. She was encouraged to take her Vitamin D and follow her higher calcium diet and increase strengthening exercise to help strengthen her bones and decrease her risk of osteopenia and osteoporosis.  Repetitive spaced learning was employed today  to elicit superior memory formation and behavioral change.  8. Class 1 obesity with serious comorbidity and body mass index (BMI) of 33.0 to 33.9 in adult, unspecified obesity type Sharon Barry is currently in the action stage of change and her goal is to continue with weight loss efforts. I recommend Sharon Barry begin the structured treatment plan as follows:  She has agreed to the [employer].  Exercise goals: No exercise has been prescribed at this time.   Behavioral modification strategies: increasing lean protein intake, increasing vegetables, meal planning and cooking strategies, keeping healthy foods in the  home and planning for success.  She was informed of the importance of frequent follow-up visits to maximize her success with intensive lifestyle modifications for her multiple health conditions. She was informed we would discuss her lab results at her next visit unless there is a critical issue that needs to be addressed sooner. Sharon Barry agreed to keep her next visit at the agreed upon time to discuss these results.  Objective:   Blood pressure 127/83, pulse 69, temperature 98.4 F (36.9 C), temperature source Oral, height 5\' 4"  (1.626 m), weight 195 lb (88.5 kg), SpO2 96 %. Body mass index is 33.47 kg/m.  EKG: T-wave flattening V3-V6, NSR at 70 bpm (similar to June 2019).  Indirect Calorimeter completed today shows a VO2 of 191 and a REE of 1329.  Her calculated basal metabolic rate is July 2019 thus her basal metabolic rate is worse than expected.  General: Cooperative, alert, well developed, in no acute distress. HEENT: Conjunctivae and lids unremarkable. Cardiovascular: Regular rhythm.  Lungs: Normal work of breathing. Neurologic: No focal deficits.   Lab Results  Component Value Date   CREATININE 0.96 12/04/2017   BUN 16 12/04/2017   NA 140 12/04/2017   K 3.8 12/04/2017   CL 104 12/04/2017   CO2 28 12/04/2017   Lab Results  Component Value Date   ALT 29 07/12/2016   AST 33 07/12/2016   ALKPHOS 80 07/12/2016   BILITOT 0.6 07/12/2016   Lab Results  Component Value Date   TSH 3.025 12/04/2017   Lab Results  Component Value Date   WBC 10.8 (H) 12/04/2017   HGB 15.8 (H) 12/04/2017   HCT 47.2 (H) 12/04/2017   MCV 90.9 12/04/2017   PLT 273 12/04/2017   Attestation Statements:   This is the patient's first visit at [facility]. The patient's NEW PATIENT PACKET was reviewed at length. Included in the packet: current and past health history, medications, allergies, ROS, gynecologic history (women only), surgical history, family history, social history, weight  history, weight loss surgery history (for those that have had weight loss surgery), nutritional evaluation, mood and food questionnaire, PHQ9, Epworth questionnaire, sleep habits questionnaire, patient life and health improvement goals questionnaire. These will all be scanned into the patient's chart under media.   During the visit, I independently reviewed the patient's EKG, bioimpedance scale results, and indirect calorimeter results. I used this information to tailor a meal plan for the patient that will help her to lose weight and will improve her obesity-related conditions going forward. I performed a medically necessary appropriate examination and/or evaluation. I discussed the assessment and treatment plan with the patient. The patient was provided an opportunity to ask questions and all were answered. The patient agreed with the plan and demonstrated an understanding of the instructions. Labs were ordered at this visit and will be reviewed at the next visit unless more critical results need to be addressed immediately.  Clinical information was updated and documented in the EMR.   Time spent on visit including pre-visit chart review and post-visit care was 45 minutes.   A separate 15 minutes was spent on risk counseling (see above).    I, Insurance claims handler, CMA, am acting as transcriptionist for Reuben Likes, MD.  I have reviewed the above documentation for accuracy and completeness, and I agree with the above. - Katherina Mires, MD

## 2019-10-31 LAB — LIPID PANEL WITH LDL/HDL RATIO
Cholesterol, Total: 281 mg/dL — ABNORMAL HIGH (ref 100–199)
HDL: 49 mg/dL (ref >39)
LDL Chol Calc (NIH): 206 mg/dL — ABNORMAL HIGH (ref 0–99)
LDL/HDL Ratio: 4.2 ratio — ABNORMAL HIGH (ref 0.0–3.2)
Triglycerides: 143 mg/dL (ref 0–149)
VLDL Cholesterol Cal: 26 mg/dL (ref 5–40)

## 2019-10-31 LAB — COMPREHENSIVE METABOLIC PANEL
ALT: 48 IU/L — ABNORMAL HIGH (ref 0–32)
AST: 33 IU/L (ref 0–40)
Albumin/Globulin Ratio: 1.7 (ref 1.2–2.2)
Albumin: 4.5 g/dL (ref 3.8–4.8)
Alkaline Phosphatase: 102 IU/L (ref 48–121)
BUN/Creatinine Ratio: 15 (ref 12–28)
BUN: 17 mg/dL (ref 8–27)
Bilirubin Total: 0.4 mg/dL (ref 0.0–1.2)
CO2: 26 mmol/L (ref 20–29)
Calcium: 10.4 mg/dL — ABNORMAL HIGH (ref 8.7–10.3)
Chloride: 104 mmol/L (ref 96–106)
Creatinine, Ser: 1.15 mg/dL — ABNORMAL HIGH (ref 0.57–1.00)
GFR calc Af Amer: 58 mL/min/{1.73_m2} — ABNORMAL LOW (ref >59)
GFR calc non Af Amer: 50 mL/min/{1.73_m2} — ABNORMAL LOW (ref >59)
Globulin, Total: 2.6 g/dL (ref 1.5–4.5)
Glucose: 87 mg/dL (ref 65–99)
Potassium: 4.6 mmol/L (ref 3.5–5.2)
Sodium: 142 mmol/L (ref 134–144)
Total Protein: 7.1 g/dL (ref 6.0–8.5)

## 2019-10-31 LAB — INSULIN, RANDOM: INSULIN: 32.9 u[IU]/mL — ABNORMAL HIGH (ref 2.6–24.9)

## 2019-10-31 LAB — FOLATE: Folate: 11 ng/mL (ref >3.0)

## 2019-10-31 LAB — CBC WITH DIFFERENTIAL/PLATELET
Basophils Absolute: 0 10*3/uL (ref 0.0–0.2)
Basos: 1 %
EOS (ABSOLUTE): 0.1 10*3/uL (ref 0.0–0.4)
Eos: 1 %
Hematocrit: 46.3 % (ref 34.0–46.6)
Hemoglobin: 14.4 g/dL (ref 11.1–15.9)
Immature Grans (Abs): 0.1 10*3/uL (ref 0.0–0.1)
Immature Granulocytes: 1 %
Lymphocytes Absolute: 2.1 10*3/uL (ref 0.7–3.1)
Lymphs: 27 %
MCH: 29.3 pg (ref 26.6–33.0)
MCHC: 31.1 g/dL — ABNORMAL LOW (ref 31.5–35.7)
MCV: 94 fL (ref 79–97)
Monocytes Absolute: 0.5 10*3/uL (ref 0.1–0.9)
Monocytes: 6 %
Neutrophils Absolute: 5 10*3/uL (ref 1.4–7.0)
Neutrophils: 64 %
Platelets: 306 10*3/uL (ref 150–450)
RBC: 4.92 x10E6/uL (ref 3.77–5.28)
RDW: 13.1 % (ref 11.7–15.4)
WBC: 7.8 10*3/uL (ref 3.4–10.8)

## 2019-10-31 LAB — HEMOGLOBIN A1C
Est. average glucose Bld gHb Est-mCnc: 114 mg/dL
Hgb A1c MFr Bld: 5.6 % (ref 4.8–5.6)

## 2019-10-31 LAB — VITAMIN D 25 HYDROXY (VIT D DEFICIENCY, FRACTURES): Vit D, 25-Hydroxy: 26.3 ng/mL — ABNORMAL LOW (ref 30.0–100.0)

## 2019-10-31 LAB — TSH: TSH: 1.26 u[IU]/mL (ref 0.450–4.500)

## 2019-10-31 LAB — T4: T4, Total: 6.7 ug/dL (ref 4.5–12.0)

## 2019-10-31 LAB — T3: T3, Total: 140 ng/dL (ref 71–180)

## 2019-10-31 LAB — VITAMIN B12: Vitamin B-12: 571 pg/mL (ref 232–1245)

## 2019-11-13 ENCOUNTER — Ambulatory Visit (INDEPENDENT_AMBULATORY_CARE_PROVIDER_SITE_OTHER): Payer: 59 | Admitting: Family Medicine

## 2019-11-26 ENCOUNTER — Ambulatory Visit (INDEPENDENT_AMBULATORY_CARE_PROVIDER_SITE_OTHER): Payer: 59 | Admitting: Family Medicine

## 2019-11-26 ENCOUNTER — Encounter (INDEPENDENT_AMBULATORY_CARE_PROVIDER_SITE_OTHER): Payer: Self-pay | Admitting: Family Medicine

## 2019-11-26 ENCOUNTER — Other Ambulatory Visit: Payer: Self-pay

## 2019-11-26 VITALS — BP 112/76 | HR 71 | Temp 98.5°F | Ht 64.0 in | Wt 192.0 lb

## 2019-11-26 DIAGNOSIS — E559 Vitamin D deficiency, unspecified: Secondary | ICD-10-CM | POA: Diagnosis not present

## 2019-11-26 DIAGNOSIS — R7989 Other specified abnormal findings of blood chemistry: Secondary | ICD-10-CM | POA: Diagnosis not present

## 2019-11-26 DIAGNOSIS — E8881 Metabolic syndrome: Secondary | ICD-10-CM

## 2019-11-26 DIAGNOSIS — Z9189 Other specified personal risk factors, not elsewhere classified: Secondary | ICD-10-CM

## 2019-11-26 DIAGNOSIS — R7401 Elevation of levels of liver transaminase levels: Secondary | ICD-10-CM | POA: Diagnosis not present

## 2019-11-26 DIAGNOSIS — E669 Obesity, unspecified: Secondary | ICD-10-CM

## 2019-11-26 DIAGNOSIS — E7849 Other hyperlipidemia: Secondary | ICD-10-CM | POA: Diagnosis not present

## 2019-11-26 DIAGNOSIS — Z6833 Body mass index (BMI) 33.0-33.9, adult: Secondary | ICD-10-CM

## 2019-11-26 MED ORDER — VITAMIN D (ERGOCALCIFEROL) 1.25 MG (50000 UNIT) PO CAPS: 50000.0000 [IU] | ORAL_CAPSULE | ORAL | 0 refills | Status: AC

## 2019-11-26 MED ORDER — METFORMIN HCL 500 MG PO TABS: 500.0000 mg | ORAL_TABLET | Freq: Every day | ORAL | 0 refills | Status: DC

## 2019-11-28 NOTE — Progress Notes (Signed)
Chief Complaint:   OBESITY Sharon Barry is here to discuss her progress with her obesity treatment plan along with follow-up of her obesity related diagnoses. Sharon Barry is on the [employer] and states she is following her eating plan approximately 85% of the time. Sharon Barry states she is doing 0 minutes 0 times per week.  Today's visit was #: 2 Starting weight: 195 lbs Starting date: 10/30/2019 Today's weight: 192 lbs Today's date: 11/26/2019 Total lbs lost to date: 3 Total lbs lost since last in-office visit: 3  Interim History: Sharon Barry struggled with wanting to snack more and especially between 8-10 pm. She is taking in [place], chicken, and fish for her protein sources. She is feeling hungry in the afternoon and especially in the evenings. She has been told she has insulin resistance in the past, eating the entire plan and not having a problem getting it all in.  Subjective:   1. Elevated serum creatinine [name] has possible dehydration, as she drinks very little water during the daytime. Her blood pressure is well controlled. I discussed labs with the patient today.  2. Transaminitis [name]'s ALT is elevated and she denies a history of this. She denies complaints or excessive ETOH. I discussed labs with the patient today.  3. Other hyperlipidemia [name]'s LDL is elevated at 206. She notes a family history of hyperlipidemia. I discussed labs with the patient today.  4. Vitamin D deficiency Sharon Barry notes fatigue. She is post menopausal and is at high risk of osteoporosis. I discussed labs with the patient today.  5. Insulin resistance Sharon Barry's fasting insulin level is 32.9. She has excessive hunger throughout the day. I discussed labs with the patient today.  6. At risk for diabetes mellitus Sharon Barry is at higher than average risk for developing diabetes due to her obesity.   Assessment/Plan:   1. Elevated serum creatinine We will recheck labs in 3 months. Counseling was provided regarding  things that can increase her serum creatinine. Sharon Barry will continue her diet plan.  2. Transaminitis We will recheck labs in 3 month. Sharon Barry will continue with weight loss and diet plan.  3. Other hyperlipidemia Cardiovascular risk and specific lipid/LDL goals reviewed. We discussed several lifestyle modifications today and Sharon Barry will continue to work on diet, exercise and weight loss efforts. Sharon Barry will follow up with her primary care physician in regards to possible treatment. We will continue to monitor. Orders and follow up as documented in patient record.   4. Vitamin D deficiency Low Vitamin D level contributes to fatigue and are associated with obesity, breast, and colon cancer. [name] agreed to start prescription Vitamin D 50,000 IU every week with no refills. She will follow-up for routine testing of Vitamin D, at least 2-3 times per year to avoid over-replacement.  - Vitamin D, Ergocalciferol, (DRISDOL) 1.25 MG (50000 UNIT) CAPS capsule; Take 1 capsule (50,000 Units total) by mouth every 7 (seven) days.  Dispense: 4 capsule; Refill: 0  5. Insulin resistance Sharon Barry will continue to work on weight loss, exercise, and decreasing simple carbohydrates to help decrease the risk of diabetes. Sharon Barry agreed to start metformin 500 mg daily with no refills. Counseling was provided regarding the meaning of a new onset of insulin resistance. Sharon Barry agreed to follow-up with [place] as directed to closely monitor her progress.  - metFORMIN (GLUCOPHAGE) 500 MG tablet; Take 1 tablet (500 mg total) by mouth daily with breakfast.  Dispense: 30 tablet; Refill: 0  6. At risk for diabetes mellitus Sharon Barry was  given approximately 30 minutes of diabetes education and counseling today. We discussed intensive lifestyle modifications today with an emphasis on weight loss as well as increasing exercise and decreasing simple carbohydrates in her diet. We also reviewed medication options with an emphasis on risk versus benefit of  those discussed.   Repetitive spaced learning was employed today to elicit superior memory formation and behavioral change.  7. Class 1 obesity with serious comorbidity and body mass index (BMI) of 33.0 to 33.9 in adult, unspecified obesity type Sharon Barry is currently in the action stage of change. As such, her goal is to continue with weight loss efforts. She has agreed to the [employer] + 100 calories.   Handouts provided today: Snack Options, and Insulin Resistance.  Exercise goals: As is.  Behavioral modification strategies: increasing lean protein intake and increasing water intake.  Sharon Barry has agreed to follow-up with our clinic in 2 weeks. She was informed of the importance of frequent follow-up visits to maximize her success with intensive lifestyle modifications for her multiple health conditions.   Objective:   Blood pressure 112/76, pulse 71, temperature 98.5 F (36.9 C), temperature source Oral, height 5\' 4"  (1.626 m), weight 192 lb (87.1 kg), SpO2 96 %. Body mass index is 32.96 kg/m.  General: Cooperative, alert, well developed, in no acute distress. HEENT: Conjunctivae and lids unremarkable. Cardiovascular: Regular rhythm.  Lungs: Normal work of breathing. Neurologic: No focal deficits.   Lab Results  Component Value Date   CREATININE 1.15 (H) 10/30/2019   BUN 17 10/30/2019   NA 142 10/30/2019   K 4.6 10/30/2019   CL 104 10/30/2019   CO2 26 10/30/2019   Lab Results  Component Value Date   ALT 48 (H) 10/30/2019   AST 33 10/30/2019   ALKPHOS 102 10/30/2019   BILITOT 0.4 10/30/2019   Lab Results  Component Value Date   HGBA1C 5.6 10/30/2019   Lab Results  Component Value Date   INSULIN 32.9 (H) 10/30/2019   Lab Results  Component Value Date   TSH 1.260 10/30/2019   Lab Results  Component Value Date   CHOL 281 (H) 10/30/2019   HDL 49 10/30/2019   LDLCALC 206 (H) 10/30/2019   TRIG 143 10/30/2019   Lab Results  Component Value Date   WBC 7.8  10/30/2019   HGB 14.4 10/30/2019   HCT 46.3 10/30/2019   MCV 94 10/30/2019   PLT 306 10/30/2019   No results found for: IRON, TIBC, FERRITIN  Attestation Statements:   Reviewed by clinician on day of visit: allergies, medications, problem list, medical history, surgical history, family history, social history, and previous encounter notes.   I, Trixie Dredge, am acting as transcriptionist for Dennard Nip, MD.  I have reviewed the above documentation for accuracy and completeness, and I agree with the above. -  Dennard Nip, MD

## 2019-12-10 ENCOUNTER — Other Ambulatory Visit: Payer: Self-pay

## 2019-12-10 ENCOUNTER — Ambulatory Visit (INDEPENDENT_AMBULATORY_CARE_PROVIDER_SITE_OTHER): Payer: 59 | Admitting: Family Medicine

## 2019-12-10 ENCOUNTER — Encounter (INDEPENDENT_AMBULATORY_CARE_PROVIDER_SITE_OTHER): Payer: Self-pay | Admitting: Family Medicine

## 2019-12-10 VITALS — BP 121/74 | HR 62 | Temp 98.3°F | Ht 64.0 in | Wt 190.0 lb

## 2019-12-10 DIAGNOSIS — Z9189 Other specified personal risk factors, not elsewhere classified: Secondary | ICD-10-CM

## 2019-12-10 DIAGNOSIS — E8881 Metabolic syndrome: Secondary | ICD-10-CM

## 2019-12-10 DIAGNOSIS — E669 Obesity, unspecified: Secondary | ICD-10-CM

## 2019-12-10 DIAGNOSIS — E559 Vitamin D deficiency, unspecified: Secondary | ICD-10-CM | POA: Diagnosis not present

## 2019-12-10 DIAGNOSIS — Z6832 Body mass index (BMI) 32.0-32.9, adult: Secondary | ICD-10-CM

## 2019-12-10 MED ORDER — METFORMIN HCL 500 MG PO TABS: ORAL_TABLET | ORAL | 0 refills | Status: AC

## 2019-12-11 NOTE — Progress Notes (Signed)
Chief Complaint:   OBESITY Sharon Barry is here to discuss her progress with her obesity treatment plan along with follow-up of her obesity related diagnoses. Sharon Barry is on the [employer] Plan +100 calories and states she is following her eating plan approximately 70% of the time. Sharon Barry states she is exercising for 0 minutes 0 times per week.  Today's visit was #: 3 Starting weight: 195 lbs Starting date: 10/30/2019 Today's weight: 190 lbs Today's date: 12/10/2019 Total lbs lost to date: 5 lbs Total lbs lost since last in-office visit: 2 lbs  Interim History: At Sharon Barry's last office visit with Dr. Dalbert Barry, 100 calories were added in due to hunger.  She often skips lunch.  She says she does not get a chance to eat during her work day.  She is also not weighing proteins at night or during the day.  She is not sure how much she is getting in.  Subjective:   1. Insulin resistance Dr. Dalbert Barry started Sharon Barry on metformin at last office visit to help with hunger.  She has concerns about it.  She reports having GERD and loose stools.  Otherwise, she is tolerating it well.  Lab Results  Component Value Date   INSULIN 32.9 (H) 10/30/2019   Lab Results  Component Value Date   HGBA1C 5.6 10/30/2019   2. Vitamin D deficiency Sharon Barry's Vitamin D level was 26.3 on 10/30/2019. She is currently taking prescription vitamin D 50,000 IU each week. She denies nausea, vomiting or muscle weakness.  Vitamin D was started at last office visit.  3. At risk for medication nonadherence Sharon Barry is at a higher than average risk for medication non-adherence.  I had a long discussion with her regarding metformin use to ensure medication adherence and her comfort with the medication and dose.  Assessment/Plan:   1. Insulin resistance Sharon Barry will continue to work on weight loss, exercise, and decreasing simple carbohydrates to help decrease the risk of diabetes. Sharon Barry agreed to follow-up with [place] as directed to closely  monitor her progress.  Will change metformin dose to 1/2 tablet every morning and 1/2 tablet at noon. - metFORMIN (GLUCOPHAGE) 500 MG tablet; Take 1/2 tablet every morning and 1/2 tablet at [facility].  Dispense: 30 tablet; Refill: 0  2. Vitamin D deficiency Low Vitamin D level contributes to fatigue and are associated with obesity, breast, and colon cancer. She agrees to continue to take prescription Vitamin D @50 ,000 IU every week and will follow-up for routine testing of Vitamin D, at least 2-3 times per year to avoid over-replacement.  3. At risk for medication nonadherence Sharon Barry was given approximately 15 minutes of counseling today to help her avoid medication non-adherence.  We discussed importance of taking medications at a similar time each day and the use of daily pill organizers to help improve medication adherence.  Repetitive spaced learning was employed today to elicit superior memory formation and behavioral change.  4. Class 1 obesity with serious comorbidity and body mass index (BMI) of 32.0 to 32.9 in adult, unspecified obesity type Sharon Barry is currently in the action stage of change. As such, her goal is to continue with weight loss efforts. She has agreed to the Sharon Barry Plan +100 calories.   Exercise goals: As is.  Behavioral modification strategies: increasing lean protein intake, weighing food and calculating amount, increasing water intake and no skipping meals.  Sharon Barry has agreed to follow-up with our clinic in 2 weeks. She was informed of the importance of frequent follow-up  visits to maximize her success with intensive lifestyle modifications for her multiple health conditions.   Objective:   Blood pressure 121/74, pulse 62, temperature 98.3 F (36.8 C), temperature source Oral, height 5\' 4"  (1.626 m), weight 190 lb (86.2 kg), SpO2 97 %. Body mass index is 32.61 kg/m.  General: Cooperative, alert, well developed, in no acute distress. HEENT: Conjunctivae and lids  unremarkable. Cardiovascular: Regular rhythm.  Lungs: Normal work of breathing. Neurologic: No focal deficits.   Lab Results  Component Value Date   CREATININE 1.15 (H) 10/30/2019   BUN 17 10/30/2019   NA 142 10/30/2019   K 4.6 10/30/2019   CL 104 10/30/2019   CO2 26 10/30/2019   Lab Results  Component Value Date   ALT 48 (H) 10/30/2019   AST 33 10/30/2019   ALKPHOS 102 10/30/2019   BILITOT 0.4 10/30/2019   Lab Results  Component Value Date   HGBA1C 5.6 10/30/2019   Lab Results  Component Value Date   INSULIN 32.9 (H) 10/30/2019   Lab Results  Component Value Date   TSH 1.260 10/30/2019   Lab Results  Component Value Date   CHOL 281 (H) 10/30/2019   HDL 49 10/30/2019   LDLCALC 206 (H) 10/30/2019   TRIG 143 10/30/2019   Lab Results  Component Value Date   WBC 7.8 10/30/2019   HGB 14.4 10/30/2019   HCT 46.3 10/30/2019   MCV 94 10/30/2019   PLT 306 10/30/2019   Attestation Statements:   Reviewed by clinician on day of visit: allergies, medications, problem list, medical history, surgical history, family history, social history, and previous encounter notes.  I, 11/01/2019, CMA, am acting as Insurance claims handler for Energy manager, DO.  I have reviewed the above documentation for accuracy and completeness, and I agree with the above. [employer], DO

## 2020-01-01 ENCOUNTER — Ambulatory Visit (INDEPENDENT_AMBULATORY_CARE_PROVIDER_SITE_OTHER): Payer: 59 | Admitting: Family Medicine

## 2020-05-11 ENCOUNTER — Ambulatory Visit: Payer: 59 | Admitting: Podiatry

## 2020-05-14 ENCOUNTER — Ambulatory Visit: Payer: 59 | Admitting: Podiatry

## 2020-05-18 ENCOUNTER — Ambulatory Visit: Payer: 59 | Admitting: Podiatry

## 2020-05-28 ENCOUNTER — Other Ambulatory Visit: Payer: Self-pay

## 2020-05-28 ENCOUNTER — Ambulatory Visit (INDEPENDENT_AMBULATORY_CARE_PROVIDER_SITE_OTHER): Payer: 59

## 2020-05-28 ENCOUNTER — Ambulatory Visit: Payer: 59 | Admitting: Podiatry

## 2020-05-28 VITALS — BP 139/79 | HR 71 | Temp 98.3°F

## 2020-05-28 DIAGNOSIS — M2032 Hallux varus (acquired), left foot: Secondary | ICD-10-CM

## 2020-05-28 DIAGNOSIS — M21611 Bunion of right foot: Secondary | ICD-10-CM | POA: Diagnosis not present

## 2020-05-28 DIAGNOSIS — M79672 Pain in left foot: Secondary | ICD-10-CM | POA: Diagnosis not present

## 2020-05-28 DIAGNOSIS — R209 Unspecified disturbances of skin sensation: Secondary | ICD-10-CM | POA: Diagnosis not present

## 2020-05-28 DIAGNOSIS — M21612 Bunion of left foot: Secondary | ICD-10-CM

## 2020-05-28 NOTE — Patient Instructions (Signed)

## 2020-05-30 NOTE — Progress Notes (Signed)
Subjective:   Patient ID: Sharon Barry, female   DOB: 64 y.o.   MRN: [id number]   HPI 64 year old female presents the office today for concerns of chronic ongoing foot pain on her left side.  She points along the medial bunion where she has majority discomfort as well as her fifth toe is curled in her third toe causing discomfort resulting in hyperkeratotic lesions and pain.  She has attempted numerous conservative treatments including offloading, padding, shoe modifications and despite that she is continued to have symptoms.  She wants to discuss surgical options.  She is also concerned about circulation as her feet get cold.  Denies any other claudication symptoms.   Review of Systems  All other systems reviewed and are negative.  Past Medical History:  Diagnosis Date  . Allergic rhinitis, cause unspecified    skin test pos 10/18/06  . Angio-edema   . Anxiety   . Asthma   . Back pain   . Bilateral swelling of feet   . Colon polyps   . Constipation   . Depression   . Eczema   . Esophageal reflux   . Fatty liver   . Fibromyalgia   . Gallbladder problem   . Hyperlipidemia   . IBS (irritable bowel syndrome)   . Interstitial cystitis   . Joint pain   . Lactose intolerance   . OSA (obstructive sleep apnea)    uses CPAP   . Shortness of breath   . Swallowing difficulty   . Unspecified asthma(493.90)    mild reversable obst small airways  . Urticaria, unspecified    insects, chinese food  . Vitamin D deficiency     Past Surgical History:  Procedure Laterality Date  . ABDOMINAL HYSTERECTOMY    . ADENOIDECTOMY    . APPENDECTOMY    . CHOLECYSTECTOMY N/A 01/19/2017   Procedure: LAPAROSCOPIC CHOLECYSTECTOMY;  Surgeon: Abigail Miyamoto, MD;  Location: [facility] OR;  Service: General;  Laterality: N/A;  . LAPAROSCOPIC CHOLECYSTECTOMY  01/19/2017  . TONSILLECTOMY  2012  . TUBAL LIGATION    . VESICOVAGINAL FISTULA CLOSURE W/ TAH       Current Outpatient Medications:  .  albuterol  (PROVENTIL HFA;VENTOLIN HFA) 108 (90 BASE) MCG/ACT inhaler, Inhale 1-2 puffs into the lungs every 6 (six) hours as needed for wheezing or shortness of breath., Disp: , Rfl:  .  Ascorbic Acid (VITAMIN C PO), Take by mouth., Disp: , Rfl:  .  azelastine (ASTELIN) 0.1 % nasal spray, Place 2 sprays into both nostrils 2 (two) times daily. Use in each nostril as directed, Disp: 30 mL, Rfl: 5 .  B Complex-C-E-Zn (B COMPLEX-C-E-ZINC) tablet, Take 1 tablet by mouth daily., Disp: , Rfl:  .  Cholecalciferol (VITAMIN D3 PO), Take by mouth., Disp: , Rfl:  .  estradiol (ESTRACE) 0.1 MG/GM vaginal cream, 2 gms/vagina HS twice weekly and apply a small amount to vaginal opening at same time., Disp: , Rfl:  .  fexofenadine (ALLEGRA) 180 MG tablet, Take 180 mg by mouth daily., Disp: , Rfl:  .  glycopyrrolate (ROBINUL) 1 MG tablet, Take 1 mg by mouth 3 (three) times daily., Disp: , Rfl:  .  hydrOXYzine (ATARAX/VISTARIL) 25 MG tablet, Take 25 mg by mouth at bedtime. 1 at bedtime, Disp: , Rfl:  .  metFORMIN (GLUCOPHAGE) 500 MG tablet, Take 1/2 tablet every morning and 1/2 tablet at Noon., Disp: 30 tablet, Rfl: 0 .  montelukast (SINGULAIR) 10 MG tablet, Take 10 mg by mouth at bedtime.,  Disp: , Rfl:  .  Multiple Vitamins-Minerals (VITAMIN-MINERAL SUPPLEMENT PO), Take by mouth. Hair, Skin, and Nails, Disp: , Rfl:  .  ondansetron (ZOFRAN-ODT) 8 MG disintegrating tablet, Take 8 mg by mouth every 8 (eight) hours as needed. For nausea/vomiting, Disp: , Rfl:  .  Policosanol 10 MG CAPS, Take 10 mg by mouth., Disp: , Rfl:  .  Red Yeast Rice Extract (RED YEAST RICE PO), Take by mouth. 2 TABS DAILY, Disp: , Rfl:  .  venlafaxine XR (EFFEXOR-XR) 75 MG 24 hr capsule, Take 75 mg by mouth daily., Disp: , Rfl:  .  Vitamin D, Ergocalciferol, (DRISDOL) 1.25 MG (50000 UNIT) CAPS capsule, Take 1 capsule (50,000 Units total) by mouth every 7 (seven) days., Disp: 4 capsule, Rfl: 0  Allergies  Allergen Reactions  . Codeine Itching    "funny  feeling"  . Fluticasone-Salmeterol Other (See Comments)    (ADVAIR)-asthma worsened  . Statins Other (See Comments)    Elevated LFTS & muscle or joint pains  . Sulfonamide Derivatives Itching and Swelling  . Hydrocodone-Acetaminophen Rash         Objective:  Physical Exam  General: AAO x3, NAD  Dermatological: Hyperkeratotic lesion of the dorsal aspect left fifth toe causing pitting due to digital deformity.  There is no ongoing ulceration or signs of infection.  Is no open lesions.  Vascular: Dorsalis Pedis artery and Posterior Tibial artery pedal pulses are 2/4 bilateral with immedate capillary fill time. There is no pain with calf compression, swelling, warmth, erythema.   Neruologic: Grossly intact via light touch bilateral.   Musculoskeletal: Mild bunions present.  Upon weightbearing the toe is in rectus position but there is a mild medial eminence causing discomfort.  Hammertoe contracture present of the third toes with adductovarus of the fifth toe both causing discomfort.  No other areas of discomfort identified.  Muscular strength 5/5 in all groups tested bilateral.  Gait: Unassisted, Nonantalgic.       Assessment:   Mild bunion left foot with hammertoes    Plan:  -Treatment options discussed including all alternatives, risks, and complications -Etiology of symptoms were discussed -X-rays were obtained and reviewed with the patient. Mild bunion evident with hammertoe contractures present.  There is no evidence of acute fracture. -We discussed both conservative as well as surgical treatment options.  She is attempted numerous conservative treatments with insignificant resolution and it is time she looks proceed with surgical intervention. -We discussed that she looks to proceed with silver bunionectomy, hammertoe repair of the third and fifth toes with possible pin placed in the third toe. -The incision placement as well as the postoperative course was discussed with the  patient. I discussed risks of the surgery which include, but not limited to, infection, bleeding, pain, swelling, need for further surgery, delayed or nonhealing, painful or ugly scar, numbness or sensation changes, over/under correction, recurrence, transfer lesions, further deformity, hardware failure, DVT/PE, loss of toe/foot. Patient understands these risks and wishes to proceed with surgery. The surgical consent was reviewed with the patient all 3 pages were signed. No promises or guarantees were given to the outcome of the procedure. All questions were answered to the best of my ability. Before the surgery the patient was encouraged to call the office if there is any further questions. The surgery will be performed at the [facility] on an outpatient basis. -Arterial studies preoperatively  Vivi Barrack DPM

## 2020-06-16 ENCOUNTER — Other Ambulatory Visit (HOSPITAL_COMMUNITY): Payer: Self-pay | Admitting: Podiatry

## 2020-06-16 DIAGNOSIS — M79672 Pain in left foot: Secondary | ICD-10-CM

## 2020-06-16 DIAGNOSIS — M79671 Pain in right foot: Secondary | ICD-10-CM

## 2020-06-22 ENCOUNTER — Ambulatory Visit (HOSPITAL_COMMUNITY): Admission: RE | Admit: 2020-06-22 | Payer: 59 | Source: Ambulatory Visit

## 2020-07-06 ENCOUNTER — Other Ambulatory Visit: Payer: Self-pay

## 2020-07-06 ENCOUNTER — Ambulatory Visit (HOSPITAL_COMMUNITY)
Admission: RE | Admit: 2020-07-06 | Discharge: 2020-07-06 | Disposition: A | Discharge status: Home / Self Care | Payer: 59 | Source: Ambulatory Visit | Attending: Cardiology | Admitting: Cardiology

## 2020-07-06 DIAGNOSIS — R600 Localized edema: Secondary | ICD-10-CM

## 2020-07-06 DIAGNOSIS — M79672 Pain in left foot: Secondary | ICD-10-CM | POA: Insufficient documentation

## 2020-07-06 DIAGNOSIS — M79671 Pain in right foot: Secondary | ICD-10-CM | POA: Insufficient documentation

## 2020-07-06 DIAGNOSIS — R209 Unspecified disturbances of skin sensation: Secondary | ICD-10-CM | POA: Insufficient documentation

## 2020-07-08 ENCOUNTER — Telehealth: Payer: Self-pay | Admitting: Podiatry

## 2020-07-08 NOTE — Telephone Encounter (Signed)
Patient would like to be allowed to work from home beginning 08/05/2020. Please advise?

## 2020-07-08 NOTE — Telephone Encounter (Signed)
DOS: 07/15/2020  Procedures: Silver Bunionectomy Lt (28292) & Hammertoe Repair 3rd & 5th Lt (28285)  [employer] Effective From 06/13/2020 - 06/12/2021  Deductible: $500 with $0 met and $500 remaining. Out of Pocket: $5,000 with $63.78 met and $4,936.22 remaining. CoInsurance: 20% Copay: $150   

## 2020-07-08 NOTE — Telephone Encounter (Signed)
That should be ok at that point postop.

## 2020-07-10 ENCOUNTER — Telehealth: Payer: Self-pay

## 2020-07-10 NOTE — Telephone Encounter (Signed)
DOS 07/15/2020  SILVER BUNIONECTOMY LT - 28292 HAMMERTOE REPAIR 3 & 5 LT - 28285  [employer] EFFECTIVE DATE - 06/13/2020  PLAN DEDUCTIBLE - $500.00 w/ $500.00 remaining OUT OF POCKET - $5000.00 w/ $7510.25 remaining COPAY $150.00 COINSURANCE - 20%    NOTIFICATION/PRIOR AUTHORIZATION NUMBER CASE STATUS CASE STATUS REASON PRIMARY CARE PHYSICIAN [id number] Closed Case Was Managed And Is Now Complete - ADVANCE NOTIFY DATE/TIME ADMISSION NOTIFY DATE/TIME 07/08/2020 02:25 PM CST - COVERAGE STATUS OVERALL COVERAGE STATUS Covered/Approved 1-3 CODE DESCRIPTION COVERAGE STATUS DECISION DATE FAC [facility] Coverage determination is reflected for the facility admission and is not a guarantee of payment for ongoing services. Covered/Approved 07/08/2020 1 28285 Correction, hammertoe (eg, interphalange more Covered/Approved 07/08/2020 2 28285 Correction, hammertoe (eg, interphalange more Covered/Approved 07/08/2020 3 28292 Correction, hallux valgus (bunionectomy) more Covered/Approved 07/08/2020

## 2020-07-14 ENCOUNTER — Telehealth: Payer: Self-pay | Admitting: Podiatry

## 2020-07-14 NOTE — Telephone Encounter (Signed)
Attempted to call patient to go over circulation test results and also to see if she had any questions about surgery tomorrow. No answer. Left VM

## 2020-07-15 ENCOUNTER — Encounter: Payer: Self-pay | Admitting: Podiatry

## 2020-07-15 ENCOUNTER — Other Ambulatory Visit: Payer: Self-pay | Admitting: Podiatry

## 2020-07-15 DIAGNOSIS — M2042 Other hammer toe(s) (acquired), left foot: Secondary | ICD-10-CM | POA: Diagnosis not present

## 2020-07-15 DIAGNOSIS — M2012 Hallux valgus (acquired), left foot: Secondary | ICD-10-CM | POA: Diagnosis not present

## 2020-07-15 MED ORDER — TRAMADOL HCL 50 MG PO TABS: 50.0000 mg | ORAL_TABLET | Freq: Three times a day (TID) | ORAL | 0 refills | Status: AC | PRN

## 2020-07-15 MED ORDER — IBUPROFEN 600 MG PO TABS: 600.0000 mg | ORAL_TABLET | Freq: Three times a day (TID) | ORAL | 0 refills | Status: AC | PRN

## 2020-07-15 MED ORDER — PROMETHAZINE HCL 25 MG PO TABS: 25.0000 mg | ORAL_TABLET | Freq: Three times a day (TID) | ORAL | 0 refills | Status: AC | PRN

## 2020-07-15 MED ORDER — CEPHALEXIN 500 MG PO CAPS: 500.0000 mg | ORAL_CAPSULE | Freq: Three times a day (TID) | ORAL | 2 refills | Status: DC

## 2020-07-15 NOTE — Progress Notes (Signed)
Post-op medication sent 

## 2020-07-18 ENCOUNTER — Other Ambulatory Visit: Payer: Self-pay | Admitting: Podiatry

## 2020-07-18 MED ORDER — TRAMADOL HCL 50 MG PO TABS: 50.0000 mg | ORAL_TABLET | Freq: Four times a day (QID) | ORAL | 0 refills | Status: AC | PRN

## 2020-07-20 ENCOUNTER — Other Ambulatory Visit: Payer: Self-pay

## 2020-07-20 ENCOUNTER — Ambulatory Visit (INDEPENDENT_AMBULATORY_CARE_PROVIDER_SITE_OTHER): Payer: 59 | Admitting: Podiatry

## 2020-07-20 ENCOUNTER — Ambulatory Visit (INDEPENDENT_AMBULATORY_CARE_PROVIDER_SITE_OTHER): Payer: 59

## 2020-07-20 DIAGNOSIS — M21612 Bunion of left foot: Secondary | ICD-10-CM

## 2020-07-20 DIAGNOSIS — M2032 Hallux varus (acquired), left foot: Secondary | ICD-10-CM

## 2020-07-20 DIAGNOSIS — M21611 Bunion of right foot: Secondary | ICD-10-CM | POA: Diagnosis not present

## 2020-07-21 ENCOUNTER — Telehealth: Payer: Self-pay | Admitting: *Deleted

## 2020-07-21 NOTE — Telephone Encounter (Signed)
Called and left a message for the patient on 07-16-2020 stating that I was calling to see how the patient was doing after having surgery with Dr Ardelle Anton and to call the office if any concerns or questions. Misty Stanley

## 2020-07-22 NOTE — Progress Notes (Signed)
Subjective: Sharon Barry is a 65 y.o. is seen today in office s/p left foot silver bunionectomy, hammertoe repair of the third and fourth toes preformed on 07/15/2020.  Doing well and her pain is controlled.  Still in the cam boot.  No recent injury or falls.  She uses a cane to assist in ambulation.  Denies any systemic complaints such as fevers, chills, nausea, vomiting. No calf pain, chest pain, shortness of breath.   Objective: General: No acute distress, AAOx3 -presents with daughter DP/PT pulses palpable 2/4, CRT < 3 sec to all digits.  Protective sensation intact. Motor function intact.  LEFT foot: Incision is well coapted without any evidence of dehiscence with sutures intact. There is no surrounding erythema, ascending cellulitis, fluctuance, crepitus, malodor, drainage/purulence. There is mild edema around the surgical site. There is minimal pain along the surgical site. Toes are in a rectus position. No pain with MTPJ ROM.  No other areas of tenderness to bilateral lower extremities.  No other open lesions or pre-ulcerative lesions.  No pain with calf compression, swelling, warmth, erythema.   Assessment and Plan:  Status post left foot surgery, doing well with no complications   -Treatment options discussed including all alternatives, risks, and complications -X-rays obtained reviewed.  No evidence of acute fracture or complicating factors.  Status post Silver bunionectomy, hammertoe repair third and fifth toes. -Antibiotic ointment and a dressing applied.  Keep the dressing clean, dry, intact. -Ice/elevation -Pain medication as needed. -CAM boot, WBAT -Monitor for any clinical signs or symptoms of infection and DVT/PE and directed to call the office immediately should any occur or go to the ER. -Follow-up as scheduled for possible suture removal or sooner if any problems arise. In the meantime, encouraged to call the office with any questions, concerns, change in symptoms.    Ovid Curd, DPM

## 2020-07-27 ENCOUNTER — Other Ambulatory Visit: Payer: Self-pay

## 2020-07-27 ENCOUNTER — Ambulatory Visit (INDEPENDENT_AMBULATORY_CARE_PROVIDER_SITE_OTHER): Payer: 59

## 2020-07-27 DIAGNOSIS — M2032 Hallux varus (acquired), left foot: Secondary | ICD-10-CM

## 2020-07-27 NOTE — Progress Notes (Signed)
Patient stated that she was in the shower when water began to run down her leg and ultimately soak her dressing to the left foot. Patient removed the wet dressing and can to the office to have new dressing applied. Sutures intact. Patient denies N/V, fever and chills Sterile dressing was applied to the left foot and antibiotic ointment was applied to the incision.

## 2020-07-30 ENCOUNTER — Other Ambulatory Visit: Payer: Self-pay

## 2020-07-30 ENCOUNTER — Ambulatory Visit (INDEPENDENT_AMBULATORY_CARE_PROVIDER_SITE_OTHER): Payer: 59 | Admitting: Podiatry

## 2020-07-30 DIAGNOSIS — M21612 Bunion of left foot: Secondary | ICD-10-CM

## 2020-07-30 DIAGNOSIS — M21611 Bunion of right foot: Secondary | ICD-10-CM

## 2020-07-30 DIAGNOSIS — M79672 Pain in left foot: Secondary | ICD-10-CM

## 2020-07-30 DIAGNOSIS — M2032 Hallux varus (acquired), left foot: Secondary | ICD-10-CM

## 2020-08-04 NOTE — Progress Notes (Signed)
Subjective: Sharon Barry is a 65 y.o. is seen today in office s/p left foot silver bunionectomy, hammertoe repair of the third and fourth toes preformed on 07/15/2020.  She presents to the procedure movements and she is doing well.  She presents today with her daughter.  She is been wearing the cam boot.  She has been icing elevating no recent injury or falls and she has no other concerns today. Denies any systemic complaints such as fevers, chills, nausea, vomiting. No calf pain, chest pain, shortness of breath.   Objective: General: No acute distress, AAOx3 -presents with daughter DP/PT pulses palpable 2/4, CRT < 3 sec to all digits.  Protective sensation intact. Motor function intact.  LEFT foot: Incision is well coapted without any evidence of dehiscence with sutures intact. There is no surrounding erythema, ascending cellulitis, fluctuance, crepitus, malodor, drainage/purulence. There is mild edema around the surgical site however improved. There is no significant pain along the surgical site. Toes are in a rectus position. No pain with MTPJ ROM.  No other areas of tenderness to bilateral lower extremities.  No other open lesions or pre-ulcerative lesions.  No pain with calf compression, swelling, warmth, erythema.   Assessment and Plan:  Status post left foot surgery, doing well with no complications   -Treatment options discussed including all alternatives, risks, and complications -Sutures removed today without complications incision made well coapted.  Ptotic ointment and dressing applied.  She can start to shower tomorrow.  Apply a small amount of antibiotic ointment and dressing daily. -Short cam boot was dispensed.  She was unable to tolerate the surgical shoe. -Ice/elevation -Pain medication as needed. -CAM boot, WBAT -Discussed with her returning to work on February 24 due to telework for 3 weeks and then she can get intermittent for 6 weeks. -Monitor for any clinical signs or  symptoms of infection and DVT/PE and directed to call the office immediately should any occur or go to the ER. -Follow-up as scheduled or sooner if any problems arise. In the meantime, encouraged to call the office with any questions, concerns, change in symptoms.   Ovid Curd, DPM

## 2020-08-13 ENCOUNTER — Ambulatory Visit (INDEPENDENT_AMBULATORY_CARE_PROVIDER_SITE_OTHER): Payer: 59 | Admitting: Podiatry

## 2020-08-13 ENCOUNTER — Other Ambulatory Visit: Payer: Self-pay

## 2020-08-13 ENCOUNTER — Ambulatory Visit (INDEPENDENT_AMBULATORY_CARE_PROVIDER_SITE_OTHER): Payer: 59

## 2020-08-13 DIAGNOSIS — M2032 Hallux varus (acquired), left foot: Secondary | ICD-10-CM

## 2020-08-13 DIAGNOSIS — M21611 Bunion of right foot: Secondary | ICD-10-CM

## 2020-08-13 DIAGNOSIS — M79672 Pain in left foot: Secondary | ICD-10-CM

## 2020-08-13 DIAGNOSIS — M21612 Bunion of left foot: Secondary | ICD-10-CM

## 2020-08-19 NOTE — Progress Notes (Signed)
Subjective: Sharon Barry is a 65 y.o. is seen today in office s/p left foot silver bunionectomy, hammertoe repair of the third and fourth toes preformed on 07/15/2020.  States that she has been doing well not having any significant discomfort.  Swelling some mild swelling but improving as well.  No redness or warmth she reports that she denies any fevers, chills, nausea, vomiting.  No calf pain, chest pain, shortness of breath.  No other concerns today.  Denies any systemic complaints such as fevers, chills, nausea, vomiting. No calf pain, chest pain, shortness of breath.   Objective: General: No acute distress, AAOx3 -presents with daughter DP/PT pulses palpable 2/4, CRT < 3 sec to all digits.  Protective sensation intact. Motor function intact.  LEFT foot: Incision is well coapted without any evidence of dehiscence.  There is minimal edema.  There is no surrounding erythema, ascending cellulitis.  There is no fluctuation crepitation there is no malodor.  No other areas of tenderness to bilateral lower extremities.  No other open lesions or pre-ulcerative lesions.  No pain with calf compression, swelling, warmth, erythema.   Assessment and Plan:  Status post left foot surgery, doing well with no complications   -Treatment options discussed including all alternatives, risks, and complications -Incisions are healing well.  I continue with a small nondistended back ointment and a dressing daily.  She can wear surgical shoe as able.  Continue ice elevate. -Monitor for any clinical signs or symptoms of infection and directed to call the office immediately should any occur or go to the ER.  Return in about 2 weeks (around 08/27/2020).  Vivi Barrack DPM

## 2020-08-27 ENCOUNTER — Telehealth: Payer: Self-pay

## 2020-08-27 ENCOUNTER — Other Ambulatory Visit: Payer: Self-pay

## 2020-08-27 ENCOUNTER — Ambulatory Visit (INDEPENDENT_AMBULATORY_CARE_PROVIDER_SITE_OTHER): Payer: 59 | Admitting: Podiatry

## 2020-08-27 DIAGNOSIS — M21612 Bunion of left foot: Secondary | ICD-10-CM

## 2020-08-27 DIAGNOSIS — M2032 Hallux varus (acquired), left foot: Secondary | ICD-10-CM

## 2020-08-27 DIAGNOSIS — M21611 Bunion of right foot: Secondary | ICD-10-CM

## 2020-08-27 DIAGNOSIS — M79672 Pain in left foot: Secondary | ICD-10-CM

## 2020-08-27 NOTE — Telephone Encounter (Signed)
Order for PT sent to [facility]

## 2020-08-30 NOTE — Progress Notes (Signed)
Subjective: Sharon Barry is a 65 y.o. is seen today in office s/p left foot silver bunionectomy, hammertoe repair of the third and fourth toes preformed on 07/15/2020.  She says overall she is feeling better is not as painful.  She still in the cam boot.  She has no recent injury or falls or any changes otherwise since I last saw her.  No new concerns today. Denies any systemic complaints such as fevers, chills, nausea, vomiting. No calf pain, chest pain, shortness of breath.   Objective: General: No acute distress, AAOx3 -presents with daughter DP/PT pulses palpable 2/4, CRT < 3 sec to all digits.  Protective sensation intact. Motor function intact.  LEFT foot: Incision is well coapted without any evidence of dehiscence and scars are well formed.  The toes are in rectus position with trace edema.  There is no erythema or warmth there is no signs of infection.  The toes are in rectus position.  There is no significant discomfort. No pain with calf compression, swelling, warmth, erythema.   Assessment and Plan:  Status post left foot surgery, doing well with no complications   -Treatment options discussed including all alternatives, risks, and complications -Incisions are well-healed.  Continue with range of motion exercises and also I referred her to physical therapy today.  She can transition to regular shoe as tolerated.  Continue ice and elevation as well as compression to control any postoperative edema although minimal now.  Vivi Barrack DPM

## 2020-10-12 ENCOUNTER — Encounter: Payer: 59 | Admitting: Podiatry

## 2020-10-26 ENCOUNTER — Encounter: Payer: 59 | Admitting: Podiatry

## 2020-11-17 ENCOUNTER — Other Ambulatory Visit: Payer: Self-pay

## 2020-11-17 ENCOUNTER — Ambulatory Visit (INDEPENDENT_AMBULATORY_CARE_PROVIDER_SITE_OTHER): Payer: 59 | Admitting: Podiatry

## 2020-11-17 DIAGNOSIS — M21611 Bunion of right foot: Secondary | ICD-10-CM

## 2020-11-17 DIAGNOSIS — M2042 Other hammer toe(s) (acquired), left foot: Secondary | ICD-10-CM

## 2020-11-17 DIAGNOSIS — M21612 Bunion of left foot: Secondary | ICD-10-CM

## 2020-11-17 DIAGNOSIS — M79672 Pain in left foot: Secondary | ICD-10-CM

## 2020-11-17 MED ORDER — KETOCONAZOLE 2 % EX CREA: 1.0000 "application " | TOPICAL_CREAM | Freq: Every day | CUTANEOUS | 0 refills | Status: AC

## 2020-11-22 NOTE — Progress Notes (Signed)
Subjective: Sharon Barry is a 65 y.o. is seen today in office s/p left foot silver bunionectomy, hammertoe repair of the third and fourth toes preformed on 07/15/2020.  She states that she still getting some swelling to the third toe which makes it difficult to wear shoe.  She is wearing an open toed shoe but difficult to wear closed in shoe.  No recent injury or trauma.  No other concerns at this time.  Denies any fevers or chills.  No nausea or vomiting or other systemic symptoms.  Objective: General: No acute distress, AAOx3 -presents with daughter DP/PT pulses palpable 2/4, CRT < 3 sec to all digits.  Protective sensation intact. Motor function intact.  LEFT foot: Incision is well coapted without any evidence of dehiscence and scars are well formed.  The toes are in rectus position.  Trace edema to the surgical sites except of the third toe which there is more swelling compared to the other digits although mild still.  There is no erythema or warmth.  There is no areas of fluctuation.  No obvious signs of infection.   No pain with calf compression, swelling, warmth, erythema.   Assessment and Plan:  Status post left foot surgery, residual swelling  -Treatment options discussed including all alternatives, risks, and complications -I showed her how to tape the toe to help decrease the edema.  Continue ice daily.  Anti-inflammatories as needed.  Discussed wearing shoes with soft material in the toebox as well as a shoe that fits appropriately nothing too narrow or tight on her foot.  I do expect the edema to improve over time.   Vivi Barrack DPM

## 2020-11-28 ENCOUNTER — Telehealth: Payer: Self-pay | Admitting: Allergy & Immunology

## 2020-11-28 MED ORDER — ALBUTEROL SULFATE (2.5 MG/3ML) 0.083% IN NEBU: 2.5000 mg | INHALATION_SOLUTION | RESPIRATORY_TRACT | 0 refills | Status: AC | PRN

## 2020-11-28 MED ORDER — ALBUTEROL SULFATE HFA 108 (90 BASE) MCG/ACT IN AERS: 1.0000 | INHALATION_SPRAY | Freq: Four times a day (QID) | RESPIRATORY_TRACT | 0 refills | Status: AC | PRN

## 2020-11-28 NOTE — Telephone Encounter (Signed)
Patient called to request albuterol (both MDI and nebulizer) for use during the worsening breathing due to the heat. Confirmed pharmacy. When I went to send in the medication, I realized that she was well overdue for a visit. I did not send any refills on the medications.   Please call to schedule office visit.   Thanks, Malachi Bonds, MD [facility]

## 2020-11-30 NOTE — Telephone Encounter (Signed)
Please call patient back to schedule an office visit. 30 day courtesy refills have been sent in by [facility] for the albuterol inhaler and neb solution.

## 2020-11-30 NOTE — Telephone Encounter (Signed)
Patient scheduled as a new patient for 02/11/2021 at 2pm with Dr. Delorse Lek in [place] since patient has not been seen since 2018.

## 2021-01-21 ENCOUNTER — Ambulatory Visit: Payer: 59 | Admitting: Podiatry

## 2021-02-11 ENCOUNTER — Ambulatory Visit: Payer: 59 | Admitting: Allergy

## 2021-02-18 ENCOUNTER — Ambulatory Visit: Payer: 59 | Admitting: Podiatry

## 2021-02-18 ENCOUNTER — Other Ambulatory Visit: Payer: Self-pay

## 2021-02-18 DIAGNOSIS — R609 Edema, unspecified: Secondary | ICD-10-CM

## 2021-02-18 DIAGNOSIS — M21612 Bunion of left foot: Secondary | ICD-10-CM | POA: Diagnosis not present

## 2021-02-18 DIAGNOSIS — M2042 Other hammer toe(s) (acquired), left foot: Secondary | ICD-10-CM

## 2021-02-18 DIAGNOSIS — M21611 Bunion of right foot: Secondary | ICD-10-CM

## 2021-02-22 ENCOUNTER — Other Ambulatory Visit: Payer: Self-pay

## 2021-02-22 ENCOUNTER — Ambulatory Visit (HOSPITAL_COMMUNITY)
Admission: RE | Admit: 2021-02-22 | Discharge: 2021-02-22 | Disposition: A | Discharge status: Home / Self Care | Payer: 59 | Source: Ambulatory Visit | Attending: Podiatry | Admitting: Podiatry

## 2021-02-22 DIAGNOSIS — R609 Edema, unspecified: Secondary | ICD-10-CM | POA: Diagnosis not present

## 2021-02-24 NOTE — Progress Notes (Signed)
Subjective: Sharon Barry is a 65 y.o. is seen today in office s/p left foot silver bunionectomy, hammertoe repair of the third and fourth toes preformed on 07/15/2020.  She states that in the procedure site she not having any pain.  She was having some swelling to the toes but that has improved.  She said that she has been getting swelling to the foot not having any pain.  No pain or swelling to the calf.  She does get pain she states in her upper leg near her hip.  No chest pain or shortness of breath.  Objective: General: No acute distress, AAOx3 -presents with daughter DP/PT pulses palpable 2/4, CRT < 3 sec to all digits.  Protective sensation intact. Motor function intact.  LEFT foot: Incision is well coapted without any evidence of dehiscence and scars are well formed.  There is mild swelling to dorsal aspect of the left foot there is no erythema or warmth.  No area of pinpoint tenderness.  No significant edema to the ankle there is no area of tenderness or pain with range of motion of the ankle or subtalar joint.  There is no pain with calf compression, erythema or warmth.  She describes some swelling and pain in her thigh.    No pain with calf compression, swelling, warmth, erythema.   Assessment and Plan:  Status post left foot surgery, residual swelling  -Treatment options discussed including all alternatives, risks, and complications -From a surgical standpoint she is doing fine.  Most of the swelling to the leg as well as the pain.  Order venous duplex without DVT although my suspicion is low for this.  If this is normal I recommended follow-up with sports medicine or orthopedics for her hip pain and swelling.  Vivi Barrack DPM

## 2021-02-25 ENCOUNTER — Other Ambulatory Visit: Payer: Self-pay | Admitting: Allergy & Immunology

## 2021-03-13 ENCOUNTER — Emergency Department (HOSPITAL_BASED_OUTPATIENT_CLINIC_OR_DEPARTMENT_OTHER)
Admission: EM | Admit: 2021-03-13 | Discharge: 2021-03-13 | Disposition: A | Discharge status: Home / Self Care | Payer: 59 | Attending: Emergency Medicine | Admitting: Emergency Medicine

## 2021-03-13 ENCOUNTER — Encounter (HOSPITAL_BASED_OUTPATIENT_CLINIC_OR_DEPARTMENT_OTHER): Payer: Self-pay

## 2021-03-13 ENCOUNTER — Emergency Department (HOSPITAL_BASED_OUTPATIENT_CLINIC_OR_DEPARTMENT_OTHER): Payer: 59

## 2021-03-13 ENCOUNTER — Other Ambulatory Visit: Payer: Self-pay

## 2021-03-13 DIAGNOSIS — J3489 Other specified disorders of nose and nasal sinuses: Secondary | ICD-10-CM | POA: Insufficient documentation

## 2021-03-13 DIAGNOSIS — R059 Cough, unspecified: Secondary | ICD-10-CM | POA: Diagnosis present

## 2021-03-13 DIAGNOSIS — Z87891 Personal history of nicotine dependence: Secondary | ICD-10-CM | POA: Insufficient documentation

## 2021-03-13 DIAGNOSIS — J45909 Unspecified asthma, uncomplicated: Secondary | ICD-10-CM | POA: Insufficient documentation

## 2021-03-13 DIAGNOSIS — Z7951 Long term (current) use of inhaled steroids: Secondary | ICD-10-CM | POA: Diagnosis not present

## 2021-03-13 DIAGNOSIS — J069 Acute upper respiratory infection, unspecified: Secondary | ICD-10-CM | POA: Insufficient documentation

## 2021-03-13 DIAGNOSIS — Z20822 Contact with and (suspected) exposure to covid-19: Secondary | ICD-10-CM | POA: Insufficient documentation

## 2021-03-13 LAB — RESP PANEL BY RT-PCR (FLU A&B, COVID) ARPGX2
Influenza A by PCR: NEGATIVE
Influenza B by PCR: NEGATIVE
SARS Coronavirus 2 by RT PCR: NEGATIVE

## 2021-03-13 MED ORDER — BENZONATATE 100 MG PO CAPS: 100.0000 mg | ORAL_CAPSULE | Freq: Three times a day (TID) | ORAL | 0 refills | Status: AC | PRN

## 2021-03-13 MED ORDER — DEXAMETHASONE 4 MG PO TABS
6.0000 mg | ORAL_TABLET | Freq: Once | ORAL | Status: AC
  Administered 2021-03-13: 6 mg via ORAL
  Filled 2021-03-13: qty 2

## 2021-03-13 NOTE — ED Triage Notes (Signed)
Pt c/o non-productive cough and SOB for three days. States she has used her inhaler without relief. Denies fever, N/V/D. Pt endorses change in taste and would like to be tested for COVID.

## 2021-03-13 NOTE — ED Provider Notes (Signed)
[facility] EMERGENCY DEPARTMENT Provider Note   CSN: [id number] Arrival date & time: 03/13/21  3710     History Chief Complaint  Patient presents with   Cough   Shortness of Breath    Sharon Barry is a 65 y.o. female.   Cough Associated symptoms: rhinorrhea, shortness of breath and sore throat   Associated symptoms: no chest pain, no chills, no ear pain, no fever, no headaches, no myalgias and no wheezing   Shortness of Breath Associated symptoms: cough and sore throat   Associated symptoms: no abdominal pain, no chest pain, no ear pain, no fever, no headaches, no neck pain, no vomiting and no wheezing   Patient presents for cough and shortness of breath over the past 3 days.  She denies any fevers or GI symptoms.  She has had a change in taste.  She suspects she may have COVID-19.  She has a history of asthma and does have inhalers as well as nebulizer at home.  She has been using her inhalers.  She denies any significant shortness of breath.  She is most concerned about her cough.  Patient developed soreness in the right side of her chest wall due to the cough.  She denies any significant sputum production.  She has been eating and drinking normally.      Past Medical History:  Diagnosis Date   Allergic rhinitis, cause unspecified    skin test pos 10/18/06   Angio-edema    Anxiety    Asthma    Back pain    Bilateral swelling of feet    Colon polyps    Constipation    Depression    Eczema    Esophageal reflux    Fatty liver    Fibromyalgia    Gallbladder problem    Hyperlipidemia    IBS (irritable bowel syndrome)    Interstitial cystitis    Joint pain    Lactose intolerance    OSA (obstructive sleep apnea)    uses CPAP    Shortness of breath    Swallowing difficulty    Unspecified asthma(493.90)    mild reversable obst small airways   Urticaria, unspecified    insects, chinese food   Vitamin D deficiency     Patient Active Problem List    Diagnosis Date Noted   Chronic cholecystitis 01/19/2017   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   Snoring 05/14/2015   Excessive daytime sleepiness 05/14/2015   Depression (emotion) 05/14/2015   Fatigue due to sleep pattern disturbance 05/14/2015   Cough 07/20/2011   ALLERGIC RHINITIS 02/15/2007   ASTHMA 02/15/2007   GERD 02/15/2007   URTICARIA 02/15/2007    Past Surgical History:  Procedure Laterality Date   ABDOMINAL HYSTERECTOMY     ADENOIDECTOMY     APPENDECTOMY     CHOLECYSTECTOMY N/A 01/19/2017   Procedure: LAPAROSCOPIC CHOLECYSTECTOMY;  Surgeon: Abigail Miyamoto, MD;  Location: [facility] OR;  Service: General;  Laterality: N/A;   LAPAROSCOPIC CHOLECYSTECTOMY  01/19/2017   TONSILLECTOMY  2012   TUBAL LIGATION     VESICOVAGINAL FISTULA CLOSURE W/ TAH       OB History     Gravida  5   Para      Term      Preterm      AB      Living         SAB      IAB      Ectopic  Multiple      Live Births              Family History  Problem Relation Age of Onset   Lung cancer Father        smoked   Eczema Father    Cancer Father    Alcoholism Father    Stomach cancer Mother        cause of death   Allergies Mother    Diabetes Mother    Hypertension Mother    Hyperlipidemia Mother    Stroke Mother    Cancer Mother    Lung cancer Brother        smoked   Asthma Brother    Lung cancer Maternal Grandfather        smoked   Bone cancer Maternal Grandmother    Allergic rhinitis Neg Hx    Angioedema Neg Hx    Immunodeficiency Neg Hx    Urticaria Neg Hx     Social History   Tobacco Use   Smoking status: Former    Packs/day: 0.50    Years: 20.00    Pack years: 10.00    Types: Cigarettes    Quit date: 06/13/1993    Years since quitting: 27.7   Smokeless tobacco: Never  Vaping Use   Vaping Use: Never used  Substance Use Topics   Alcohol use: Yes    Comment: rarely   Drug use: No    Home Medications Prior to Admission  medications   Medication Sig Start Date End Date Taking? Authorizing Provider  benzonatate (TESSALON PERLES) 100 MG capsule Take 1 capsule (100 mg total) by mouth 3 (three) times daily as needed for up to 3 days for cough. 03/13/21 03/16/21 Yes Gloris Manchester, MD  albuterol (PROVENTIL) (2.5 MG/3ML) 0.083% nebulizer solution Take 3 mLs (2.5 mg total) by nebulization every 4 (four) hours as needed for wheezing or shortness of breath. 11/28/20   Alfonse Spruce, MD  albuterol (VENTOLIN HFA) 108 (90 Base) MCG/ACT inhaler Inhale 1-2 puffs into the lungs every 6 (six) hours as needed for wheezing or shortness of breath. 11/28/20   Alfonse Spruce, MD  Ascorbic Acid (VITAMIN C PO) Take by mouth.    [provider]  azelastine (ASTELIN) 0.1 % nasal spray Place 2 sprays into both nostrils 2 (two) times daily. Use in each nostril as directed 01/12/17   Marcelyn Bruins, MD  azelastine (OPTIVAR) 0.05 % ophthalmic solution INSTILL 1 DROP INTO AFFECTED EYE TWICE A DAY 06/17/19   [provider]  azithromycin (ZITHROMAX) 250 MG tablet Take by mouth. 07/24/20   [provider]  B Complex-C-E-Zn (B COMPLEX-C-E-ZINC) tablet Take 1 tablet by mouth daily.    [provider]  budesonide-formoterol (SYMBICORT) 160-4.5 MCG/ACT inhaler Inhale into the lungs. 03/08/16   [provider]  cephALEXin (KEFLEX) 500 MG capsule Take 1 capsule (500 mg total) by mouth 3 (three) times daily. 07/15/20   Vivi Barrack, DPM  Cholecalciferol (VITAMIN D3 PO) Take by mouth.    [provider]  Cholecalciferol (VITAMIN D3) 100000 UNIT/GM POWD Take by mouth.    [provider]  clindamycin (CLEOCIN T) 1 % external solution Apply daily to twice daily as needed. 01/04/19   [provider]  Dexlansoprazole (DEXILANT) 30 MG capsule 1 cap(s) 07/02/19   [provider]  econazole nitrate 1 % cream Apply to affected toenails daily for one year. 10/24/17    [provider]  estradiol (  CLIMARA - DOSED IN MG/24 HR) 0.0375 mg/24hr patch SMARTSIG:Patch(s) T-DERMAL Once a Week 07/26/20   [provider]  estradiol (ESTRACE) 0.1 MG/GM vaginal cream 2 gms/vagina HS twice weekly and apply a small amount to vaginal opening at same time. 10/29/19   [provider]  fexofenadine (ALLEGRA) 180 MG tablet Take 180 mg by mouth daily.    [provider]  Fluocinolone Acetonide Body 0.01 % OIL Apply to scalp 2-3 times per week as needed. 07/12/19   [provider]  Fluocinolone Acetonide Scalp 0.01 % OIL Apply topically. 05/03/20   [provider]  fluticasone (FLONASE) 50 MCG/ACT nasal spray Place into the nose. 11/21/17   [provider]  fluticasone furoate-vilanterol (BREO ELLIPTA) 200-25 MCG/INH AEPB as needed. 06/17/19   [provider]  glycopyrrolate (ROBINUL) 1 MG tablet Take 1 mg by mouth 3 (three) times daily.    [provider]  hydrocortisone 2.5 % cream Apply topically. 07/05/20   [provider]  hydrOXYzine (ATARAX/VISTARIL) 25 MG tablet Take 25 mg by mouth at bedtime. 1 at bedtime 05/24/11   [provider]  hyoscyamine (LEVBID) 0.375 MG 12 hr tablet Take by mouth. 07/21/19   [provider]  ibuprofen (ADVIL) 600 MG tablet Take 1 tablet (600 mg total) by mouth every 8 (eight) hours as needed. 07/15/20   Vivi Barrack, DPM  ketoconazole (NIZORAL) 2 % cream Apply 1 application topically daily. 11/17/20   Vivi Barrack, DPM  levocetirizine (XYZAL) 5 MG tablet Take by mouth.    [provider]  metFORMIN (GLUCOPHAGE) 500 MG tablet Take 1/2 tablet every morning and 1/2 tablet at [facility]. 12/10/19   Thomasene Lot, DO  mirabegron ER (MYRBETRIQ) 25 MG TB24 tablet Take by mouth. 02/04/20   [provider]  Misc. Devices MISC Increase CPAP pressure to 12 cm. water pressure. 10/10/17   [provider]  montelukast (SINGULAIR) 10 MG  tablet Take 10 mg by mouth at bedtime.    [provider]  Multiple Vitamin (MULTIVITAMIN) capsule Take by mouth.    [provider]  Multiple Vitamins-Minerals (VITAMIN-MINERAL SUPPLEMENT PO) Take by mouth. Hair, Skin, and Nails    [provider]  olopatadine (PATANOL) 0.1 % ophthalmic solution Apply to eye. 01/12/17   [provider]  ondansetron (ZOFRAN-ODT) 8 MG disintegrating tablet Take 8 mg by mouth every 8 (eight) hours as needed. For nausea/vomiting 01/03/17   [provider]  oxyCODONE (OXY IR/ROXICODONE) 5 MG immediate release tablet Take by mouth. 01/20/17   [provider]  Policosanol 10 MG CAPS Take 10 mg by mouth.    [provider]  predniSONE (DELTASONE) 10 MG tablet Take 6 tablets a day on the first day and decrease by 1 tablet daily until gone. 12/05/16   [provider]  promethazine (PHENERGAN) 25 MG tablet Take 1 tablet (25 mg total) by mouth every 8 (eight) hours as needed for nausea or vomiting. 07/15/20   Vivi Barrack, DPM  Red Yeast Rice Extract (RED YEAST RICE PO) Take by mouth. 2 TABS DAILY    [provider]  TOVIAZ 4 MG TB24 tablet Take 4 mg by mouth daily. 05/02/20   [provider]  traMADol (ULTRAM) 50 MG tablet TAKE 1 TABLET BY MOUTH EVERY 6 HOURS AS NEEDED FOR UP TO 5 DAYS. 07/20/20   [provider]  venlafaxine XR (EFFEXOR-XR) 75 MG 24 hr capsule Take 75 mg by mouth daily. 10/31/19  [provider]  Vitamin D, Ergocalciferol, (DRISDOL) 1.25 MG (50000 UNIT) CAPS capsule Take 1 capsule (50,000 Units total) by mouth every 7 (seven) days. 11/26/19   Quillian Quince D, MD  zolpidem (AMBIEN) 5 MG tablet Take one to two tablets night of sleep study. 08/08/17   [provider]    Allergies    Sulfa antibiotics, Oseltamivir, Codeine, Fluticasone-salmeterol, Statins, Sulfonamide derivatives, and Hydrocodone-acetaminophen  Review of Systems   Review of  Systems  Constitutional:  Positive for appetite change and fatigue. Negative for chills and fever.  HENT:  Positive for congestion, rhinorrhea and sore throat. Negative for ear pain, trouble swallowing and voice change.   Eyes:  Negative for photophobia, pain and redness.  Respiratory:  Positive for cough, chest tightness and shortness of breath. Negative for wheezing.   Cardiovascular:  Negative for chest pain, palpitations and leg swelling.  Gastrointestinal:  Negative for abdominal pain, diarrhea, nausea and vomiting.  Genitourinary:  Negative for dysuria, flank pain and hematuria.  Musculoskeletal:  Negative for arthralgias, back pain, gait problem, myalgias and neck pain.  Skin:  Negative for pallor.  Neurological:  Negative for dizziness, speech difficulty, weakness, light-headedness, numbness and headaches.  Psychiatric/Behavioral:  Negative for confusion and decreased concentration.    Physical Exam Updated Vital Signs BP (!) 137/95   Pulse 64   Temp 98.2 F (36.8 C) (Oral)   Resp (!) 21   Ht 5\' 5"  (1.651 m)   Wt 90.7 kg   SpO2 94%   BMI 33.28 kg/m   Physical Exam Vitals and nursing note reviewed.  Constitutional:      General: She is not in acute distress.    Appearance: She is well-developed and normal weight. She is not ill-appearing, toxic-appearing or diaphoretic.  HENT:     Head: Normocephalic and atraumatic.     Mouth/Throat:     Mouth: Mucous membranes are moist.     Pharynx: Oropharynx is clear.  Eyes:     Conjunctiva/sclera: Conjunctivae normal.  Neck:     Vascular: No JVD.  Cardiovascular:     Rate and Rhythm: Normal rate and regular rhythm.     Heart sounds: No murmur heard. Pulmonary:     Effort: Pulmonary effort is normal. No tachypnea, accessory muscle usage or respiratory distress.     Breath sounds: Normal breath sounds. No decreased breath sounds, wheezing, rhonchi or rales.  Chest:     Chest wall: No tenderness.  Abdominal:     Palpations:  Abdomen is soft.     Tenderness: There is no abdominal tenderness.  Musculoskeletal:     Cervical back: Neck supple.     Right lower leg: No edema.     Left lower leg: No edema.  Skin:    General: Skin is warm and dry.     Coloration: Skin is not cyanotic or pale.  Neurological:     General: No focal deficit present.     Mental Status: She is alert and oriented to person, place, and time.     Cranial Nerves: No cranial nerve deficit.     Motor: No weakness.  Psychiatric:        Mood and Affect: Mood normal.        Behavior: Behavior normal.    ED Results / Procedures / Treatments   Labs (all labs ordered are listed, but only abnormal results are displayed) Labs Reviewed  RESP PANEL BY RT-PCR (FLU A&B, COVID) [id number]    EKG EKG Interpretation  Date/Time:  Saturday March 13 2021 07:34:37 EDT Ventricular Rate:  67 PR Interval:  165 QRS Duration: 94 QT Interval:  351 QTC Calculation: 371 R Axis:   0 Text Interpretation: Sinus rhythm Borderline T wave abnormalities Confirmed by Gloris Manchester [phone]) on 03/13/2021 7:48:56 AM  Radiology DG Chest Portable 1 View  Result Date: 03/13/2021 CLINICAL DATA:  Cough. EXAM: PORTABLE CHEST 1 VIEW COMPARISON:  July 31, 2019 FINDINGS: The heart size and mediastinal contours are within normal limits. Both lungs are clear. The visualized skeletal structures are unremarkable. IMPRESSION: No active disease. Electronically Signed   By: Gerome Sam III M.D.   On: 03/13/2021 07:58    Procedures Procedures   Medications Ordered in ED Medications  dexamethasone (DECADRON) tablet 6 mg (6 mg Oral Given 03/13/21 0383)    ED Course  I have reviewed the triage vital signs and the nursing notes.  Pertinent labs & imaging results that were available during my care of the patient were reviewed by me and considered in my medical decision making (see chart for details).    MDM Rules/Calculators/A&P                          Patient presents  for URI symptoms for the past 3 days.  On arrival in the ED, vital signs are normal.  She is well-appearing.  Lungs are clear to auscultation.  Breathing is even and unlabored.  Cough was witnessed in the ED exam room.  It is dry in nature.  COVID and flu testing were ordered, the results of which were negative.  Chest x-ray showed no focal opacities and no pulmonary edema.  Given her history of asthma, dose of Decadron was given.  Patient was advised to continue supportive care at home with Tylenol, ibuprofen, and inhaler/nebulizer as needed, and to return to the ED for any worsening severity of symptoms.  Prescription for Jerilynn Som was provided, to be taken as needed for relief of cough.  She was discharged in good condition.  Final Clinical Impression(s) / ED Diagnoses Final diagnoses:  Viral URI with cough    Rx / DC Orders ED Discharge Orders          Ordered    benzonatate (TESSALON PERLES) 100 MG capsule  3 times daily PRN        03/13/21 0911             Gloris Manchester, MD 03/13/21 2245

## 2021-04-01 ENCOUNTER — Other Ambulatory Visit: Payer: Self-pay | Admitting: Obstetrics & Gynecology

## 2021-04-01 DIAGNOSIS — Z1231 Encounter for screening mammogram for malignant neoplasm of breast: Secondary | ICD-10-CM

## 2021-04-30 ENCOUNTER — Other Ambulatory Visit: Payer: Self-pay | Admitting: Allergy & Immunology

## 2021-05-03 ENCOUNTER — Ambulatory Visit: Payer: 59

## 2021-05-20 ENCOUNTER — Ambulatory Visit: Payer: 59

## 2021-06-21 ENCOUNTER — Ambulatory Visit
Admission: RE | Admit: 2021-06-21 | Discharge: 2021-06-21 | Disposition: A | Discharge status: Home / Self Care | Payer: 59 | Source: Ambulatory Visit | Attending: Obstetrics & Gynecology | Admitting: Obstetrics & Gynecology

## 2021-06-21 DIAGNOSIS — Z1231 Encounter for screening mammogram for malignant neoplasm of breast: Secondary | ICD-10-CM

## 2022-01-19 ENCOUNTER — Encounter (INDEPENDENT_AMBULATORY_CARE_PROVIDER_SITE_OTHER): Payer: Self-pay

## 2022-02-02 ENCOUNTER — Encounter: Payer: Self-pay | Admitting: Podiatry

## 2022-02-02 ENCOUNTER — Ambulatory Visit: Payer: 59 | Admitting: Podiatry

## 2022-02-02 ENCOUNTER — Ambulatory Visit (INDEPENDENT_AMBULATORY_CARE_PROVIDER_SITE_OTHER): Payer: 59

## 2022-02-02 DIAGNOSIS — M2042 Other hammer toe(s) (acquired), left foot: Secondary | ICD-10-CM

## 2022-02-02 NOTE — Progress Notes (Signed)
Subjective:   Patient ID: Margot Ables, female   DOB: 66 y.o.   MRN: [id number]   HPI Patient presents concerned about the left pinky toe stating its been swollen and she does not remember specific injury.  States that she is worried about this and whether or not there could be damage.  Neurovascular   ROS      Objective:  Physical Exam  Status intact with patient found to have swelling of the left pinky toe moderate in its intensity no intense discomfort no proximal edema erythema drainage noted     Assessment:  Probability for injury to the left fifth toe that she may not have been aware of     Plan:  H&P precautionary x-ray went ahead today advised on wider shoes ice therapy and patient will be seen back for [place] to recheck again if symptoms persist may require injection or other treatment  X-rays were negative for signs of fracture she did have previous arthroplasty of the digit

## 2022-10-07 ENCOUNTER — Other Ambulatory Visit: Payer: Self-pay | Admitting: Gastroenterology

## 2022-10-07 DIAGNOSIS — R4702 Dysphasia: Secondary | ICD-10-CM

## 2022-10-14 ENCOUNTER — Other Ambulatory Visit: Payer: Self-pay | Admitting: Gastroenterology

## 2022-10-14 ENCOUNTER — Ambulatory Visit
Admission: RE | Admit: 2022-10-14 | Discharge: 2022-10-14 | Disposition: A | Discharge status: Home / Self Care | Payer: 59 | Source: Ambulatory Visit | Attending: Gastroenterology | Admitting: Gastroenterology

## 2022-10-14 DIAGNOSIS — R4702 Dysphasia: Secondary | ICD-10-CM

## 2022-11-02 ENCOUNTER — Other Ambulatory Visit: Payer: Self-pay | Admitting: *Deleted

## 2022-11-02 DIAGNOSIS — M79605 Pain in left leg: Secondary | ICD-10-CM

## 2022-11-14 ENCOUNTER — Ambulatory Visit (HOSPITAL_COMMUNITY)
Admission: RE | Admit: 2022-11-14 | Discharge: 2022-11-14 | Disposition: A | Discharge status: Home / Self Care | Payer: 59 | Source: Ambulatory Visit | Attending: Surgery | Admitting: Surgery

## 2022-11-14 DIAGNOSIS — M79605 Pain in left leg: Secondary | ICD-10-CM | POA: Diagnosis present

## 2022-11-17 ENCOUNTER — Ambulatory Visit: Payer: 59 | Admitting: Physician Assistant

## 2022-11-17 VITALS — BP 141/84 | HR 64 | Temp 97.8°F | Resp 18 | Ht 65.0 in | Wt 207.7 lb

## 2022-11-17 DIAGNOSIS — I872 Venous insufficiency (chronic) (peripheral): Secondary | ICD-10-CM

## 2022-11-17 DIAGNOSIS — I890 Lymphedema, not elsewhere classified: Secondary | ICD-10-CM

## 2022-11-17 NOTE — Progress Notes (Signed)
Requested by:  Bosie Clos, MD [address] [place],  [place] [address]  Reason for consultation: left lower extremity swelling    History of Present Illness   Sharon Barry is a 67 y.o. (February 28, 1956) female who presents for evaluation of left foot and ankle swelling.  She states that she has had this swelling for at least 10 years and it somewhat worsened after her foot surgery a couple years ago.  In the mornings she has no swelling, but by the end of the day her foot and ankle is still swollen she can barely walk on it.  This extensive swelling causes her pain and heaviness.  She denies any issues with the right foot.  She denies any swelling of the left lower leg.  She wears knee-high compression stockings and feels like they do not help her swelling anymore.  She elevates her legs but does not keep them above her heart.  She has been prescribed Lasix for her feet swelling and feels like it has helped a little bit.  She has to sit a lot for her job which makes her swelling worse.  She has no prior history of DVT or previous vein procedures.  Past Medical History:  Diagnosis Date   Allergic rhinitis, cause unspecified    skin test pos 10/18/06   Angio-edema    Anxiety    Asthma    Back pain    Bilateral swelling of feet    Colon polyps    Constipation    Depression    Eczema    Esophageal reflux    Fatty liver    Fibromyalgia    Gallbladder problem    Hyperlipidemia    IBS (irritable bowel syndrome)    Interstitial cystitis    Joint pain    Lactose intolerance    OSA (obstructive sleep apnea)    uses CPAP    Shortness of breath    Swallowing difficulty    Unspecified asthma(493.90)    mild reversable obst small airways   Urticaria, unspecified    insects, chinese food   Vitamin D deficiency     Past Surgical History:  Procedure Laterality Date   ABDOMINAL HYSTERECTOMY     ADENOIDECTOMY     APPENDECTOMY     CHOLECYSTECTOMY N/A 01/19/2017   Procedure:  LAPAROSCOPIC CHOLECYSTECTOMY;  Surgeon: Abigail Miyamoto, MD;  Location: [facility] OR;  Service: General;  Laterality: N/A;   LAPAROSCOPIC CHOLECYSTECTOMY  01/19/2017   TONSILLECTOMY  2012   TUBAL LIGATION     VESICOVAGINAL FISTULA CLOSURE W/ TAH      Social History   Socioeconomic History   Marital status: Married    Spouse name: Not on file   Number of children: 3   Years of education: Not on file   Highest education level: Not on file  Occupational History   Occupation: Child psychotherapist    Employer: [employer]  Tobacco Use   Smoking status: Former    Packs/day: 0.50    Years: 20.00    Additional pack years: 0.00    Total pack years: 10.00    Types: Cigarettes    Quit date: 06/13/1993    Years since quitting: 29.4   Smokeless tobacco: Never  Vaping Use   Vaping Use: Never used  Substance and Sexual Activity   Alcohol use: Yes    Comment: rarely   Drug use: No   Sexual activity: Not on file  Other Topics Concern  Not on file  Social History Narrative   Not on file   Social Determinants of Health   Financial Resource Strain: Not on file  Food Insecurity: Not on file  Transportation Needs: Not on file  Physical Activity: Not on file  Stress: Not on file  Social Connections: Not on file  Intimate Partner Violence: Not on file    Family History  Problem Relation Age of Onset   Stomach cancer Mother        cause of death   Allergies Mother    Diabetes Mother    Hypertension Mother    Hyperlipidemia Mother    Stroke Mother    Cancer Mother    Lung cancer Father        smoked   Eczema Father    Cancer Father    Alcoholism Father    Bone cancer Maternal Grandmother    Lung cancer Maternal Grandfather        smoked   Lung cancer Brother        smoked   Asthma Brother    Allergic rhinitis Neg Hx    Angioedema Neg Hx    Immunodeficiency Neg Hx    Urticaria Neg Hx    Breast cancer Neg Hx     Current Outpatient Medications  Medication Sig Dispense Refill    albuterol (PROVENTIL) (2.5 MG/3ML) 0.083% nebulizer solution Take 3 mLs (2.5 mg total) by nebulization every 4 (four) hours as needed for wheezing or shortness of breath. 75 mL 0   albuterol (VENTOLIN HFA) 108 (90 Base) MCG/ACT inhaler Inhale 1-2 puffs into the lungs every 6 (six) hours as needed for wheezing or shortness of breath. 1 each 0   Ascorbic Acid (VITAMIN C PO) Take by mouth.     azelastine (ASTELIN) 0.1 % nasal spray Place 2 sprays into both nostrils 2 (two) times daily. Use in each nostril as directed 30 mL 5   azelastine (OPTIVAR) 0.05 % ophthalmic solution INSTILL 1 DROP INTO AFFECTED EYE TWICE A DAY     azithromycin (ZITHROMAX) 250 MG tablet Take by mouth.     B Complex-C-E-Zn (B COMPLEX-C-E-ZINC) tablet Take 1 tablet by mouth daily.     budesonide-formoterol (SYMBICORT) 160-4.5 MCG/ACT inhaler Inhale into the lungs.     cephALEXin (KEFLEX) 500 MG capsule Take 1 capsule (500 mg total) by mouth 3 (three) times daily. 30 capsule 2   Cholecalciferol (VITAMIN D3 PO) Take by mouth.     Cholecalciferol (VITAMIN D3) 100000 UNIT/GM POWD Take by mouth.     clindamycin (CLEOCIN T) 1 % external solution Apply daily to twice daily as needed.     Dexlansoprazole (DEXILANT) 30 MG capsule 1 cap(s)     econazole nitrate 1 % cream Apply to affected toenails daily for one year.     estradiol (CLIMARA - DOSED IN MG/24 HR) 0.0375 mg/24hr patch SMARTSIG:Patch(s) T-DERMAL Once a Week     estradiol (ESTRACE) 0.1 MG/GM vaginal cream 2 gms/vagina HS twice weekly and apply a small amount to vaginal opening at same time.     fexofenadine (ALLEGRA) 180 MG tablet Take 180 mg by mouth daily.     Fluocinolone Acetonide Body 0.01 % OIL Apply to scalp 2-3 times per week as needed.     Fluocinolone Acetonide Scalp 0.01 % OIL Apply topically.     fluticasone (FLONASE) 50 MCG/ACT nasal spray Place into the nose.     fluticasone furoate-vilanterol (BREO ELLIPTA) 200-25 MCG/INH AEPB as needed.  glycopyrrolate  (ROBINUL) 1 MG tablet Take 1 mg by mouth 3 (three) times daily.     hydrocortisone 2.5 % cream Apply topically.     hydrOXYzine (ATARAX/VISTARIL) 25 MG tablet Take 25 mg by mouth at bedtime. 1 at bedtime     hyoscyamine (LEVBID) 0.375 MG 12 hr tablet Take by mouth.     ibuprofen (ADVIL) 600 MG tablet Take 1 tablet (600 mg total) by mouth every 8 (eight) hours as needed. 30 tablet 0   ketoconazole (NIZORAL) 2 % cream Apply 1 application topically daily. 60 g 0   levocetirizine (XYZAL) 5 MG tablet Take by mouth.     metFORMIN (GLUCOPHAGE) 500 MG tablet Take 1/2 tablet every morning and 1/2 tablet at [facility]. 30 tablet 0   mirabegron ER (MYRBETRIQ) 25 MG TB24 tablet Take by mouth.     Misc. Devices MISC Increase CPAP pressure to 12 cm. water pressure.     montelukast (SINGULAIR) 10 MG tablet Take 10 mg by mouth at bedtime.     Multiple Vitamin (MULTIVITAMIN) capsule Take by mouth.     Multiple Vitamins-Minerals (VITAMIN-MINERAL SUPPLEMENT PO) Take by mouth. Hair, Skin, and Nails     olopatadine (PATANOL) 0.1 % ophthalmic solution Apply to eye.     ondansetron (ZOFRAN-ODT) 8 MG disintegrating tablet Take 8 mg by mouth every 8 (eight) hours as needed. For nausea/vomiting     oxyCODONE (OXY IR/ROXICODONE) 5 MG immediate release tablet Take by mouth.     Policosanol 10 MG CAPS Take 10 mg by mouth.     predniSONE (DELTASONE) 10 MG tablet Take 6 tablets a day on the first day and decrease by 1 tablet daily until gone.     promethazine (PHENERGAN) 25 MG tablet Take 1 tablet (25 mg total) by mouth every 8 (eight) hours as needed for nausea or vomiting. 20 tablet 0   Red Yeast Rice Extract (RED YEAST RICE PO) Take by mouth. 2 TABS DAILY     TOVIAZ 4 MG TB24 tablet Take 4 mg by mouth daily.     traMADol (ULTRAM) 50 MG tablet TAKE 1 TABLET BY MOUTH EVERY 6 HOURS AS NEEDED FOR UP TO 5 DAYS.     venlafaxine XR (EFFEXOR-XR) 75 MG 24 hr capsule Take 75 mg by mouth daily.     Vitamin D, Ergocalciferol, (DRISDOL)  1.25 MG (50000 UNIT) CAPS capsule Take 1 capsule (50,000 Units total) by mouth every 7 (seven) days. 4 capsule 0   zolpidem (AMBIEN) 5 MG tablet Take one to two tablets night of sleep study.     No current facility-administered medications for this visit.    Allergies  Allergen Reactions   Sulfa Antibiotics Itching, Rash and Swelling   Oseltamivir Nausea And Vomiting   Codeine Itching    "funny feeling"   Fluticasone-Salmeterol Other (See Comments)    (ADVAIR)-asthma worsened   Statins Other (See Comments)    Elevated LFTS & muscle or joint pains   Sulfonamide Derivatives Itching and Swelling   Hydrocodone-Acetaminophen Rash    REVIEW OF SYSTEMS (negative unless checked):   Cardiac:  []  Chest pain or chest pressure? []  Shortness of breath upon activity? []  Shortness of breath when lying flat? []  Irregular heart rhythm?  Vascular:  []  Pain in calf, thigh, or hip brought on by walking? []  Pain in feet at night that wakes you up from your sleep? []  Blood clot in your veins? [x]  Foot swelling?  Pulmonary:  []  Oxygen at home? []  Productive  cough? []  Wheezing?  Neurologic:  []  Sudden weakness in arms or legs? []  Sudden numbness in arms or legs? []  Sudden onset of difficult speaking or slurred speech? []  Temporary loss of vision in one eye? []  Problems with dizziness?  Gastrointestinal:  []  Blood in stool? []  Vomited blood?  Genitourinary:  []  Burning when urinating? []  Blood in urine?  Psychiatric:  []  Major depression  Hematologic:  []  Bleeding problems? []  Problems with blood clotting?  Dermatologic:  []  Rashes or ulcers?  Constitutional:  []  Fever or chills?  Ear/Nose/Throat:  []  Change in hearing? []  Nose bleeds? []  Sore throat?  Musculoskeletal:  []  Back pain? []  Joint pain? []  Muscle pain?   Physical Examination     Vitals:   11/17/22 1106  BP: (!) 141/84  Pulse: 64  Resp: 18  Temp: 97.8 F (36.6 C)  TempSrc: Temporal  SpO2: 94%   Weight: 207 lb 11.2 oz (94.2 kg)  Height: 5\' 5"  (1.651 m)   Body mass index is 34.56 kg/m.  General:  WDWN in NAD; vital signs documented above Gait: Not observed HENT: WNL, normocephalic Pulmonary: normal non-labored breathing , without Rales, rhonchi,  wheezing Cardiac: regular Abdomen: soft, NT, no masses Skin: without rashes Vascular Exam/Pulses: palpable pedal pulses Extremities: No varicose veins, reticular veins, or ulcerations.  1+ pitting edema on dorsum of left foot. Musculoskeletal: no muscle wasting or atrophy  Neurologic: A&O X 3;  No focal weakness or paresthesias are detected Psychiatric:  The pt has Normal affect.  Non-invasive Vascular Imaging   LLE Venous Insufficiency Duplex (11/14/2022):  +--------------+---------+------+-----------+------------+--------+  LEFT         Reflux NoRefluxReflux TimeDiameter cmsComments                          Yes                                   +--------------+---------+------+-----------+------------+--------+  CFV                    yes   >1 second                       +--------------+---------+------+-----------+------------+--------+  FV mid                  yes   >1 second                       +--------------+---------+------+-----------+------------+--------+  Popliteal    no                                              +--------------+---------+------+-----------+------------+--------+  GSV at SFJ              yes    >500 ms      0.66              +--------------+---------+------+-----------+------------+--------+  GSV prox thigh          yes    >500 ms      0.83              +--------------+---------+------+-----------+------------+--------+  GSV mid thigh no  0.32              +--------------+---------+------+-----------+------------+--------+  GSV dist thighno                            0.29               +--------------+---------+------+-----------+------------+--------+  GSV at knee   no                            0.27              +--------------+---------+------+-----------+------------+--------+  GSV prox calf no                            0.21              +--------------+---------+------+-----------+------------+--------+  GSV mid calf            yes    >500 ms      0.21              +--------------+---------+------+-----------+------------+--------+  SSV Pop Fossa no                            0.20              +--------------+---------+------+-----------+------------+--------+  SSV prox calf no                            0.19              +--------------+---------+------+-----------+------------+--------+  SSV mid calf  no                            0.22              +--------------+---------+------+-----------+------------+--------+  AASV O        no                            0.19              +--------------+---------+------+-----------+------------+--------+  AASV P        no                            0.14              +--------------+---------+------+-----------+------------+--------+    Medical Decision Making   CYTHNIA SYSLO is a 67 y.o. female who presents for evaluation of left lower extremity swelling  Based on the patient's venous duplex, there is reflux in the left common femoral vein, femoral vein, greater saphenous vein at the saphenofemoral junction, proximal thigh, and mid calf.  The remainder of the deep and superficial venous system is competent.  There is no signs of DVT or SVT.  The patient's greater saphenous vein is less than 4 mm in the mid and distal thigh, which prevents her from being a candidate for greater saphenous vein ablation. On exam she has no left thigh or calf swelling.  She does have trace left ankle edema and 1+ left foot dorsum swelling.  I explained to the patient since most of her  swelling is on the top of her foot, this could be caused by  underlying lymphedema.  The patient has been measured for 20-30 knee-high compression stockings today.  She wants to buy these elsewhere.  I have encouraged that she wear these compression stockings daily, elevate her legs above her heart, and continue to exercise to reduce lower extremity swelling She can follow-up with our office as needed   Ernestene Mention, PA-C [facility] Office: [phone]  11/17/2022, 11:00 AM  Clinic MD: Edilia Bo

## 2023-06-09 ENCOUNTER — Other Ambulatory Visit: Payer: Self-pay | Admitting: Family Medicine

## 2023-06-09 DIAGNOSIS — Z1231 Encounter for screening mammogram for malignant neoplasm of breast: Secondary | ICD-10-CM

## 2023-06-28 ENCOUNTER — Ambulatory Visit
Admission: RE | Admit: 2023-06-28 | Discharge: 2023-06-28 | Disposition: A | Discharge status: Home / Self Care | Payer: 59 | Source: Ambulatory Visit | Attending: Family Medicine | Admitting: Family Medicine

## 2023-06-28 DIAGNOSIS — Z1231 Encounter for screening mammogram for malignant neoplasm of breast: Secondary | ICD-10-CM

## 2023-10-10 ENCOUNTER — Telehealth: Payer: Self-pay

## 2023-10-10 ENCOUNTER — Other Ambulatory Visit: Payer: Self-pay

## 2023-10-10 DIAGNOSIS — I872 Venous insufficiency (chronic) (peripheral): Secondary | ICD-10-CM

## 2023-10-10 DIAGNOSIS — M79605 Pain in left leg: Secondary | ICD-10-CM

## 2023-10-10 NOTE — Telephone Encounter (Signed)
 Pt called triage with c/o worsening L foot and ankle swelling that has been ongoing for years. She is unsure what compression stockings she wears but has been advised to wear 20-30 mm Hg. She was scheduled for a reflux study and to see APP. She has been encouraged to wear compression of appropriate strength and to elevate her leg above heart level, which she has not been doing. 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 She is aware of her scheduled appts.

## 2023-11-10 ENCOUNTER — Ambulatory Visit (HOSPITAL_COMMUNITY)
Admission: RE | Admit: 2023-11-10 | Discharge: 2023-11-10 | Disposition: A | Discharge status: Home / Self Care | Source: Ambulatory Visit | Attending: Vascular Surgery | Admitting: Vascular Surgery

## 2023-11-10 DIAGNOSIS — I872 Venous insufficiency (chronic) (peripheral): Secondary | ICD-10-CM

## 2023-11-10 DIAGNOSIS — M79605 Pain in left leg: Secondary | ICD-10-CM | POA: Diagnosis not present

## 2023-11-17 ENCOUNTER — Ambulatory Visit

## 2023-11-23 ENCOUNTER — Ambulatory Visit: Admitting: Vascular Surgery

## 2023-12-18 ENCOUNTER — Ambulatory Visit

## 2023-12-18 ENCOUNTER — Telehealth: Payer: Self-pay

## 2023-12-18 NOTE — Telephone Encounter (Addendum)
 Pt arrived at 9:12am for her 9:15 appt with PA and went through the check in process with this PAA. After confirming pt's information this PAA asked for her to initial on the pin pad, pt requested I wipe pen down before initialing. PAA did so and also stated she could sign on mychart to avoid touching pin pad completely. After an attempt to get in her mychart pt states she could not and still would not sign on pin pad, and stated she needed to go to work, and this visit is just to get results.. This PAA shown pt where the hand sanitizer on the wall were for her after she initialed,educated pt on what the initial was for, and explained we could not see her if she didn't sign. Pt refused to sign though pin pad was wiped down with purple wipes and left. MP, PAA/NT

## 2023-12-18 NOTE — Progress Notes (Deleted)
 Office Note     CC:  follow up Requesting Provider:  Leonce Sink, MD  HPI: Sharon Barry is a 68 y.o. (11-16-1955) female who presents for worsening swelling in left lower extremity. She was last seen 1 year ago. She has long history of lower extremity swelling > 10 years. She has tried compression stockings, elevation and fluid pills. Her prior duplex shows deep and superficial venous reflux but her GSV on prior duplex was too small to be candidate for venous ablation. Most of her swelling was thought to be related to lymphedema. She was encouraged to try conservative therapy.   Today she returns due to worsening swelling. She explains ***   Past Medical History:  Diagnosis Date   Allergic rhinitis, cause unspecified    skin test pos 10/18/06   Angio-edema    Anxiety    Asthma    Back pain    Bilateral swelling of feet    Colon polyps    Constipation    Depression    Eczema    Esophageal reflux    Fatty liver    Fibromyalgia    Gallbladder problem    Hyperlipidemia    IBS (irritable bowel syndrome)    Interstitial cystitis    Joint pain    Lactose intolerance    OSA (obstructive sleep apnea)    uses CPAP    Shortness of breath    Swallowing difficulty    Unspecified asthma(493.90)    mild reversable obst small airways   Urticaria, unspecified    insects, chinese food   Vitamin D  deficiency     Past Surgical History:  Procedure Laterality Date   ABDOMINAL HYSTERECTOMY     ADENOIDECTOMY     APPENDECTOMY     CHOLECYSTECTOMY N/A 01/19/2017   Procedure: LAPAROSCOPIC CHOLECYSTECTOMY;  Surgeon: Vernetta Berg, MD;  Location: [facility] OR;  Service: General;  Laterality: N/A;   LAPAROSCOPIC CHOLECYSTECTOMY  01/19/2017   TONSILLECTOMY  2012   TUBAL LIGATION     VESICOVAGINAL FISTULA CLOSURE W/ TAH      Social History   Socioeconomic History   Marital status: Married    Spouse name: Not on file   Number of children: 3   Years of education: Not on file   Highest  education level: Not on file  Occupational History   Occupation: Child psychotherapist    Employer: [employer]  Tobacco Use   Smoking status: Former    Current packs/day: 0.00    Average packs/day: 0.5 packs/day for 20.0 years (10.0 ttl pk-yrs)    Types: Cigarettes    Start date: 06/13/1973    Quit date: 06/13/1993    Years since quitting: 30.5   Smokeless tobacco: Never  Vaping Use   Vaping status: Never Used  Substance and Sexual Activity   Alcohol use: Yes    Comment: rarely   Drug use: No   Sexual activity: Not on file  Other Topics Concern   Not on file  Social History Narrative   Not on file   Social Drivers of Health   Financial Resource Strain: Low Risk  (08/16/2023)   Received from [facility]   Overall Financial Resource Strain (CARDIA)    Difficulty of Paying Living Expenses: Not very hard  Food Insecurity: No Food Insecurity (08/16/2023)   Received from [facility]   Hunger Vital Sign    Within the past 12 months, you worried that your food would run out before you got the money to  buy more.: Never true    Within the past 12 months, the food you bought just didn't last and you didn't have money to get more.: Never true  Transportation Needs: No Transportation Needs (08/16/2023)   Received from [facility]   PRAPARE - Transportation    Lack of Transportation (Medical): No    Lack of Transportation (Non-Medical): No  Physical Activity: Insufficiently Active (02/24/2022)   Received from [facility]   Exercise Vital Sign    On average, how many days per week do you engage in moderate to strenuous exercise (like a brisk walk)?: 1 day    On average, how many minutes do you engage in exercise at this level?: 60 min  Stress: Stress Concern Present (02/24/2022)   Received from [facility] - Occupational Stress Questionnaire    Feeling of Stress : To some extent  Social Connections: Unknown (03/02/2023)   Received from [facility]   Social Network    Social Network: Not on file  Intimate Partner Violence: Unknown (03/02/2023)   Received from [facility]   HITS    Physically Hurt: Not on file    Insult or Talk Down To: Not on file    Threaten Physical Harm: Not on file    Scream or Curse: Not on file    Family History  Problem Relation Age of Onset   Stomach cancer Mother        cause of death   Allergies Mother    Diabetes Mother    Hypertension Mother    Hyperlipidemia Mother    Stroke Mother    Cancer Mother    Lung cancer Father        smoked   Eczema Father    Cancer Father    Alcoholism Father    Bone cancer Maternal Grandmother    Lung cancer Maternal Grandfather        smoked   Lung cancer Brother        smoked   Asthma Brother    Allergic rhinitis Neg Hx    Angioedema Neg Hx    Immunodeficiency Neg Hx    Urticaria Neg Hx    Breast cancer Neg Hx     Current Outpatient Medications  Medication Sig Dispense Refill   albuterol  (PROVENTIL ) (2.5 MG/3ML) 0.083% nebulizer solution Take 3 mLs (2.5 mg total) by nebulization every 4 (four) hours as needed for wheezing or shortness of breath. 75 mL 0   albuterol  (VENTOLIN  HFA) 108 (90 Base) MCG/ACT inhaler Inhale 1-2 puffs into the lungs every 6 (six) hours as needed for wheezing or shortness of breath. 1 each 0   Ascorbic Acid (VITAMIN C PO) Take by mouth. (Patient not taking: Reported on 11/17/2022)     azelastine  (ASTELIN ) 0.1 % nasal spray Place 2 sprays into both nostrils 2 (two) times daily. Use in each nostril as directed 30 mL 5   azelastine  (OPTIVAR ) 0.05 % ophthalmic solution INSTILL 1 DROP INTO AFFECTED EYE TWICE A DAY (Patient not taking: Reported on 11/17/2022)     azithromycin (ZITHROMAX) 250 MG tablet Take by mouth. (Patient not taking: Reported on 11/17/2022)     B Complex-C-E-Zn (B COMPLEX-C-E-ZINC) tablet Take 1 tablet by mouth daily. (Patient not taking: Reported on 11/17/2022)     budesonide-formoterol (SYMBICORT) 160-4.5 MCG/ACT  inhaler Inhale into the lungs.     cephALEXin  (KEFLEX ) 500 MG capsule Take 1 capsule (500 mg total) by mouth 3 (three)  times daily. (Patient not taking: Reported on 11/17/2022) 30 capsule 2   Cholecalciferol (VITAMIN D3 PO) Take by mouth.     Cholecalciferol (VITAMIN D3) 100000 UNIT/GM POWD Take by mouth.     clindamycin (CLEOCIN T) 1 % external solution Apply daily to twice daily as needed. (Patient not taking: Reported on 11/17/2022)     Dexlansoprazole (DEXILANT) 30 MG capsule 1 cap(s) (Patient not taking: Reported on 11/17/2022)     econazole nitrate 1 % cream Apply to affected toenails daily for one year.     estradiol (CLIMARA - DOSED IN MG/24 HR) 0.0375 mg/24hr patch SMARTSIG:Patch(s) T-DERMAL Once a Week (Patient not taking: Reported on 11/17/2022)     estradiol (ESTRACE) 0.1 MG/GM vaginal cream 2 gms/vagina HS twice weekly and apply a small amount to vaginal opening at same time. (Patient not taking: Reported on 11/17/2022)     fexofenadine (ALLEGRA) 180 MG tablet Take 180 mg by mouth daily.     Fluocinolone Acetonide Body 0.01 % OIL Apply to scalp 2-3 times per week as needed.     Fluocinolone Acetonide Scalp 0.01 % OIL Apply topically.     fluticasone  (FLONASE ) 50 MCG/ACT nasal spray Place into the nose.     fluticasone  furoate-vilanterol (BREO ELLIPTA) 200-25 MCG/INH AEPB as needed.     glycopyrrolate (ROBINUL) 1 MG tablet Take 1 mg by mouth 3 (three) times daily.     hydrocortisone 2.5 % cream Apply topically.     hydrOXYzine  (ATARAX /VISTARIL ) 25 MG tablet Take 25 mg by mouth at bedtime. 1 at bedtime     hyoscyamine (LEVBID) 0.375 MG 12 hr tablet Take by mouth. (Patient not taking: Reported on 11/17/2022)     ibuprofen  (ADVIL ) 600 MG tablet Take 1 tablet (600 mg total) by mouth every 8 (eight) hours as needed. 30 tablet 0   ketoconazole  (NIZORAL ) 2 % cream Apply 1 application topically daily. 60 g 0   levocetirizine (XYZAL) 5 MG tablet Take by mouth.     metFORMIN  (GLUCOPHAGE ) 500 MG tablet  Take 1/2 tablet every morning and 1/2 tablet at [facility]. (Patient not taking: Reported on 11/17/2022) 30 tablet 0   mirabegron ER (MYRBETRIQ) 25 MG TB24 tablet Take by mouth. (Patient not taking: Reported on 11/17/2022)     Misc. Devices MISC Increase CPAP pressure to 12 cm. water pressure.     montelukast  (SINGULAIR ) 10 MG tablet Take 10 mg by mouth at bedtime.     Multiple Vitamin (MULTIVITAMIN) capsule Take by mouth.     Multiple Vitamins-Minerals (VITAMIN-MINERAL SUPPLEMENT PO) Take by mouth. Hair, Skin, and Nails (Patient not taking: Reported on 11/17/2022)     olopatadine  (PATANOL) 0.1 % ophthalmic solution Apply to eye.     ondansetron  (ZOFRAN -ODT) 8 MG disintegrating tablet Take 8 mg by mouth every 8 (eight) hours as needed. For nausea/vomiting     oxyCODONE  (OXY IR/ROXICODONE ) 5 MG immediate release tablet Take by mouth.     Policosanol 10 MG CAPS Take 10 mg by mouth. (Patient not taking: Reported on 11/17/2022)     predniSONE (DELTASONE) 10 MG tablet Take 6 tablets a day on the first day and decrease by 1 tablet daily until gone.     promethazine  (PHENERGAN ) 25 MG tablet Take 1 tablet (25 mg total) by mouth every 8 (eight) hours as needed for nausea or vomiting. 20 tablet 0   Red Yeast Rice Extract (RED YEAST RICE PO) Take by mouth. 2 TABS DAILY (Patient not taking: Reported on 11/17/2022)  TOVIAZ 4 MG TB24 tablet Take 4 mg by mouth daily. (Patient not taking: Reported on 11/17/2022)     traMADol  (ULTRAM ) 50 MG tablet TAKE 1 TABLET BY MOUTH EVERY 6 HOURS AS NEEDED FOR UP TO 5 DAYS.     venlafaxine XR (EFFEXOR-XR) 75 MG 24 hr capsule Take 75 mg by mouth daily. (Patient not taking: Reported on 11/17/2022)     Vitamin D , Ergocalciferol , (DRISDOL ) 1.25 MG (50000 UNIT) CAPS capsule Take 1 capsule (50,000 Units total) by mouth every 7 (seven) days. 4 capsule 0   zolpidem (AMBIEN) 5 MG tablet Take one to two tablets night of sleep study. (Patient not taking: Reported on 11/17/2022)     No current  facility-administered medications for this visit.    Allergies  Allergen Reactions   Sulfa Antibiotics Itching, Rash and Swelling   Oseltamivir Nausea And Vomiting   Codeine Itching    funny feeling   Fluticasone -Salmeterol Other (See Comments)    (ADVAIR)-asthma worsened   Statins Other (See Comments)    Elevated LFTS & muscle or joint pains   Sulfonamide Derivatives Itching and Swelling   Hydrocodone-Acetaminophen Rash     REVIEW OF SYSTEMS:   [X]  denotes positive finding, [ ]  denotes negative finding Cardiac  Comments:  Chest pain or chest pressure:    Shortness of breath upon exertion:    Short of breath when lying flat:    Irregular heart rhythm:        Vascular    Pain in calf, thigh, or hip brought on by ambulation:    Pain in feet at night that wakes you up from your sleep:     Blood clot in your veins:    Leg swelling:         Pulmonary    Oxygen at home:    Productive cough:     Wheezing:         Neurologic    Sudden weakness in arms or legs:     Sudden numbness in arms or legs:     Sudden onset of difficulty speaking or slurred speech:    Temporary loss of vision in one eye:     Problems with dizziness:         Gastrointestinal    Blood in stool:     Vomited blood:         Genitourinary    Burning when urinating:     Blood in urine:        Psychiatric    Major depression:         Hematologic    Bleeding problems:    Problems with blood clotting too easily:        Skin    Rashes or ulcers:        Constitutional    Fever or chills:       Physical Examination    There were no vitals filed for this visit. There is no height or weight on file to calculate BMI.  General:  WDWN in NAD; vital signs documented above Gait: Not observed HENT: WNL, normocephalic Pulmonary: normal non-labored breathing , without Rales, rhonchi,  wheezing Cardiac: {Desc; regular/irreg:14544} HR, without  Murmurs {With/Without:20273} carotid  bruit*** Abdomen: soft, NT, no masses Skin: {With/Without:20273} rashes Vascular Exam/Pulses:  Right Left  Radial {Exam; arterial pulse strength 0-4:30167} {Exam; arterial pulse strength 0-4:30167}  Ulnar {Exam; arterial pulse strength 0-4:30167} {Exam; arterial pulse strength 0-4:30167}  Femoral {Exam; arterial pulse strength 0-4:30167} {Exam; arterial pulse strength  0-4:30167}  Popliteal {Exam; arterial pulse strength 0-4:30167} {Exam; arterial pulse strength 0-4:30167}  DP {Exam; arterial pulse strength 0-4:30167} {Exam; arterial pulse strength 0-4:30167}  PT {Exam; arterial pulse strength 0-4:30167} {Exam; arterial pulse strength 0-4:30167}   Extremities: {With/Without:20273} varicose veins, {With/Without:20273} reticular veins, {With/Without:20273} edema, {With/Without:20273} stasis pigmentation, {With/Without:20273} lipodermatosclerosis, {With/Without:20273} ulcers Musculoskeletal: no muscle wasting or atrophy  Neurologic: A&O X 3;  No focal weakness or paresthesias are detected Psychiatric:  The pt has {Desc; normal/abnormal:11317::Normal} affect.  Non-invasive Vascular Imaging    BLE Venous Insufficiency Duplex (11/10/23):  LLE: No DVT and SVT,  GSV reflux SFJ, proximal thigh and mid calf GSV diameter .20 - 0.8 cm No SSV reflux CFV deep venous reflux   Medical Decision Making   VERTIE DIBBERN is a 68 y.o. female who presents with: LLE chronic venous insufficiency with swelling. Her recent duplex shows no DVT or SVT. She does have deep and superficial venous reflux. Her GSV is not large enough throughout to be a candidate for venous ablation. Her findings today are essentially unchanged from her prior duplex 1 year ago. I suspect most of her LLE swelling/edema is secondary to lymphedema.  Based on the patient's history and examination, I recommend: daily elevation of 20-30 minutes above level of heart, daily compression stocking use, exercise, weight reduction, refraining  from prolonged sitting or standing I discussed with the patient the use of her 20-30 mm knee high compression stockings Patient was provided with information about vein health Recommend she follow up with PCP regarding referral to lymphedema clinic  She can follow up as needed   Teretha Damme, PA-C [facility] Office: [phone]  12/18/2023, 8:08 AM  Clinic MD:   [place]

## 2024-05-20 ENCOUNTER — Ambulatory Visit: Admitting: Podiatry

## 2024-05-29 ENCOUNTER — Ambulatory Visit: Admitting: Podiatry

## 2024-06-03 ENCOUNTER — Ambulatory Visit: Admitting: Podiatry

## 2024-06-19 ENCOUNTER — Encounter: Payer: Self-pay | Admitting: Podiatry

## 2024-06-19 ENCOUNTER — Ambulatory Visit: Admitting: Podiatry

## 2024-06-19 DIAGNOSIS — L600 Ingrowing nail: Secondary | ICD-10-CM | POA: Diagnosis not present

## 2024-06-19 NOTE — Patient Instructions (Signed)

## 2024-06-20 NOTE — Progress Notes (Signed)
 Subjective:   Patient ID: Sharon Barry, female   DOB: 69 y.o.   MRN: [id number]   HPI Patient has chronic ingrown toenails of both big toes medial and lateral border that is been hurting for a long time.  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  I allowed her to read consent form for correction of the medial and lateral borders of each big toe and she wants this done and signed consent form and at this time I infiltrated each big toe 60 mg like Marcaine  mixture sterile prep done and using sterile instrumentation removed the medial and lateral borders exposed matrix applied phenol 3 applications 30 seconds followed by alcohol lavage to each border and applied sterile dressings.  Gave instructions on soaks and wearing dressings 24 hours take them off earlier if throbbing were to occur and call with questions

## 2024-07-04 ENCOUNTER — Telehealth: Payer: Self-pay | Admitting: Podiatry

## 2024-07-04 ENCOUNTER — Other Ambulatory Visit: Payer: Self-pay | Admitting: *Deleted

## 2024-07-04 ENCOUNTER — Encounter: Payer: Self-pay | Admitting: Podiatry

## 2024-07-04 ENCOUNTER — Ambulatory Visit

## 2024-07-04 ENCOUNTER — Ambulatory Visit: Admitting: Podiatry

## 2024-07-04 DIAGNOSIS — T148XXA Other injury of unspecified body region, initial encounter: Secondary | ICD-10-CM

## 2024-07-04 DIAGNOSIS — L600 Ingrowing nail: Secondary | ICD-10-CM | POA: Diagnosis not present

## 2024-07-04 MED ORDER — CEPHALEXIN 500 MG PO CAPS: 500.0000 mg | ORAL_CAPSULE | Freq: Three times a day (TID) | ORAL | 0 refills | Status: AC

## 2024-07-04 NOTE — Telephone Encounter (Signed)
 Called patient back; no answer- left a vm for Sharon Barry letting her know that the [facility] office was wanting to know if they can come in sooner at 1:30 because she will need x-rays for her injury.

## 2024-07-04 NOTE — Progress Notes (Signed)
"  °  Subjective:  Patient ID: Sharon Barry, female    DOB: [birth year],   MRN: [id number]  Chief Complaint  Patient presents with   Toe Injury    I had toenail surgery about a week ago.  I dropped something on it on Monday.  It's still swollen and the bleeding started again.  It hurts.  It's the big toe on the left foot.    69 y.o. female presents for concern of left great toe pain. Relates two weeks ago she had ingrown nail procedure with Dr. Magdalen. On Monday she dropped something on the toe and has been bleeding and swollen wanted to have it evaluated.  . Denies any other pedal complaints. Denies n/v/f/c.   Past Medical History:  Diagnosis Date   Allergic rhinitis, cause unspecified    skin test pos 10/18/06   Angio-edema    Anxiety    Asthma    Back pain    Bilateral swelling of feet    Colon polyps    Constipation    Depression    Eczema    Esophageal reflux    Fatty liver    Fibromyalgia    Gallbladder problem    Hyperlipidemia    IBS (irritable bowel syndrome)    Interstitial cystitis    Joint pain    Lactose intolerance    OSA (obstructive sleep apnea)    uses CPAP    Shortness of breath    Swallowing difficulty    Unspecified asthma(493.90)    mild reversable obst small airways   Urticaria, unspecified    insects, chinese food   Vitamin D  deficiency     Objective:  Physical Exam: Vascular: DP/PT pulses 2/4 bilateral. CFT <3 seconds. Normal hair growth on digits. No edema.  Skin. No lacerations or abrasions bilateral feet.  Right hallux nail healing well no signs of infection noted.  Left hallux with mild drainage noted mostly to the lateral border.  Very tender to palpation around proximal nailbed.  Mild erythema and edema noted. Musculoskeletal: MMT 5/5 bilateral lower extremities in DF, PF, Inversion and Eversion. Deceased ROM in DF of ankle joint.  Neurological: Sensation intact to light touch.   Assessment:   1. Ingrown nail      Plan:  Patient  was evaluated and treated and all questions answered. -Xrays reviewed.  No acute fracture or dislocation noted. -Discussed treatement options for toe contusion and ingrown nail; risks, alternatives, and benefits explained. - Advised to keep soaking the left great toe and keep Neosporin and a Band-Aid on the area until it dries up. -Recommend protection, rest, ice, elevation daily until symptoms improve - Keflex  sent to the pharmacy as precautionary for possible infection setting in. -Patient to return to office as needed   Asberry Failing, DPM    "

## 2024-07-04 NOTE — Telephone Encounter (Signed)
 The patient called in because they had an injury on the foot that was already having issues; dropped heavy item on L foot- swollen; red on the bottom of great toe- pain going up to ankles; wound oozing Offered 430 pm urgent slot with their normal provider Sharon Barry but the patient declined as they had a prior obligation elsewhere. Searched thru other providers available in the [place] that could assist in her treatment for today 1/22 & tmrw 1/23; there were none available that fit the necessary criteria to be able to schedule her a spot. Offered for her to possibly be treated at the [facility] location, the patient agreed and was set up with a 3:15pm with Sharon Barry
